# Patient Record
Sex: Male | Born: 1965 | Race: Asian | Hispanic: No | Marital: Married | Smoking: Never smoker
Health system: Southern US, Community
[De-identification: ages and names within clinical notes are randomized; demographics above are authoritative.]

## PROBLEM LIST (undated history)

## (undated) DIAGNOSIS — J45909 Unspecified asthma, uncomplicated: Secondary | ICD-10-CM

## (undated) DIAGNOSIS — E119 Type 2 diabetes mellitus without complications: Secondary | ICD-10-CM

## (undated) DIAGNOSIS — I10 Essential (primary) hypertension: Secondary | ICD-10-CM

## (undated) DIAGNOSIS — E785 Hyperlipidemia, unspecified: Secondary | ICD-10-CM

## (undated) HISTORY — DX: Hyperlipidemia, unspecified: E78.5

## (undated) HISTORY — DX: Unspecified asthma, uncomplicated: J45.909

---

## 2012-09-10 ENCOUNTER — Telehealth: Payer: Self-pay | Admitting: Oncology

## 2012-09-10 NOTE — Telephone Encounter (Signed)
C/D 09/10/12 for appt. 10/07/12

## 2012-09-10 NOTE — Telephone Encounter (Signed)
S/W PT IN REF TO NP APPT. 10/07/12@3 :00 REFERRING DR MORROW DX-ELEVATED RED BLOOD CELL COUNT MAILED NP PACKET

## 2012-10-07 ENCOUNTER — Ambulatory Visit: Payer: Self-pay | Admitting: Oncology

## 2012-10-07 ENCOUNTER — Ambulatory Visit: Payer: Self-pay

## 2012-10-07 ENCOUNTER — Other Ambulatory Visit: Payer: Self-pay | Admitting: Lab

## 2012-10-08 NOTE — Progress Notes (Signed)
Patient canceled.  Reschedule prn.  

## 2012-10-22 ENCOUNTER — Encounter: Payer: Self-pay | Admitting: Internal Medicine

## 2012-10-22 ENCOUNTER — Other Ambulatory Visit (HOSPITAL_BASED_OUTPATIENT_CLINIC_OR_DEPARTMENT_OTHER): Payer: BC Managed Care – PPO | Admitting: Lab

## 2012-10-22 ENCOUNTER — Ambulatory Visit (HOSPITAL_BASED_OUTPATIENT_CLINIC_OR_DEPARTMENT_OTHER): Payer: BC Managed Care – PPO | Admitting: Internal Medicine

## 2012-10-22 ENCOUNTER — Ambulatory Visit: Payer: BC Managed Care – PPO

## 2012-10-22 ENCOUNTER — Other Ambulatory Visit: Payer: Self-pay | Admitting: Oncology

## 2012-10-22 VITALS — BP 120/78 | HR 86 | Temp 97.8°F | Resp 20 | Ht 65.5 in | Wt 185.7 lb

## 2012-10-22 DIAGNOSIS — E785 Hyperlipidemia, unspecified: Secondary | ICD-10-CM | POA: Insufficient documentation

## 2012-10-22 DIAGNOSIS — D751 Secondary polycythemia: Secondary | ICD-10-CM

## 2012-10-22 DIAGNOSIS — J45909 Unspecified asthma, uncomplicated: Secondary | ICD-10-CM | POA: Insufficient documentation

## 2012-10-22 DIAGNOSIS — D72810 Lymphocytopenia: Secondary | ICD-10-CM

## 2012-10-22 DIAGNOSIS — I10 Essential (primary) hypertension: Secondary | ICD-10-CM

## 2012-10-22 DIAGNOSIS — R7989 Other specified abnormal findings of blood chemistry: Secondary | ICD-10-CM

## 2012-10-22 HISTORY — DX: Unspecified asthma, uncomplicated: J45.909

## 2012-10-22 HISTORY — DX: Hyperlipidemia, unspecified: E78.5

## 2012-10-22 LAB — CBC WITH DIFFERENTIAL/PLATELET
Basophils Absolute: 0 10*3/uL (ref 0.0–0.1)
Eosinophils Absolute: 0.4 10*3/uL (ref 0.0–0.5)
HGB: 15.7 g/dL (ref 13.0–17.1)
MCV: 78.7 fL — ABNORMAL LOW (ref 79.3–98.0)
NEUT#: 3 10*3/uL (ref 1.5–6.5)
RDW: 13.3 % (ref 11.0–14.6)
lymph#: 1.5 10*3/uL (ref 0.9–3.3)

## 2012-10-22 LAB — HIV ANTIBODY (ROUTINE TESTING W REFLEX): HIV: NONREACTIVE

## 2012-10-22 LAB — MORPHOLOGY
PLT EST: ADEQUATE
White Cell Comments: 1

## 2012-10-22 LAB — CHCC SMEAR

## 2012-10-22 NOTE — Progress Notes (Signed)
Checked in new pt with no financial concerns. °

## 2012-10-22 NOTE — Progress Notes (Signed)
[facility] [phone]) [phone]   [phone]  CONSULT NOTE  REFERRING PHYSICIAN: Dr. Geri Seminole  REASON FOR CONSULTATION:  47 years old white male with polycythemia.  HPI Jon Vega is a 47 y.o. male was no significant past medical history except for asthma, hypertension and dyslipidemia. The patient is a never smoker. He was found by his primary care physician to have elevated hemoglobin and hematocrit on recent blood work. He was also complaining at that time of low-grade fever as well as chills especially at night time. The symptoms happen intermittently .  Unfortunately I don't have any lab results from his primary care's office. The patient is feeling fine today with no specific complaints. He denied having any significant weight loss or night sweats. He denied having any chest pain, but shortness of breath secondary to history of asthma, no cough or hemoptysis.  He complains of occasional mild fatigue. He denied having any significant nausea or vomiting no change in bowel movement. Today he is feeling good. His family history significant for her mother with diabetes mellitus and fibromyalgia, father with heart disease and obstructive sleep apnea and brother with stroke. The patient is married and has 2 daughters. 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 He has no history of smoking, alcohol or drug abuse. @SFHPI @  Past Medical History  Diagnosis Date  . Asthma     History reviewed. No pertinent past surgical history.  Family History  Problem Relation Age of Onset  . Clotting disorder Mother   . Diabetes Mother   . Hypertension Mother   . Hypertension Father   . Stroke Brother     Social History History  Substance Use Topics  . Smoking status: Never Smoker   . Smokeless tobacco: Not on file  . Alcohol Use: 0.0 oz/week    0-1 Glasses of wine, 0-1 Cans of beer per week    No Known Allergies  Current Outpatient  Prescriptions  Medication Sig Dispense Refill  . albuterol (PROVENTIL HFA;VENTOLIN HFA) 108 (90 BASE) MCG/ACT inhaler Inhale 2 puffs into the lungs every 6 (six) hours as needed for wheezing.      Marland Kitchen aspirin 81 MG tablet Take 81 mg by mouth daily.      . milk thistle 175 MG tablet Take 175 mg by mouth daily.      . Multiple Vitamin (MULTIVITAMIN) tablet Take 1 tablet by mouth daily.      Marland Kitchen amLODipine-benazepril (LOTREL) 10-20 MG per capsule Take 1 capsule by mouth daily.      Marland Kitchen atorvastatin (LIPITOR) 20 MG tablet Take 1 tablet by mouth daily.       No current facility-administered medications for this visit.    Review of Systems  A comprehensive review of systems was negative.  Physical Exam  AVW:UJWJX, healthy, no distress, well nourished and well developed SKIN: skin color, texture, turgor are normal HEAD: Normocephalic, No masses, lesions, tenderness or abnormalities EYES: normal, PERRLA EARS: External ears normal OROPHARYNX:no exudate and no erythema  NECK: supple, no adenopathy LYMPH:  no palpable lymphadenopathy, no hepatosplenomegaly LUNGS: clear to auscultation  HEART: regular rate & rhythm, no murmurs and no gallops ABDOMEN:abdomen soft, non-tender, normal bowel sounds and no masses or organomegaly BACK: Back symmetric, no curvature. EXTREMITIES:no joint deformities, effusion, or inflammation, no edema, no skin discoloration  NEURO: alert & oriented x 3 with fluent speech, no focal motor/sensory deficits  PERFORMANCE STATUS: ECOG 0  LABORATORY DATA:  Lab Results  Component Value Date   WBC 5.4 10/22/2012   HGB 15.7 10/22/2012   HCT 47.9 10/22/2012   MCV 78.7* 10/22/2012   PLT 230 10/22/2012      Chemistry   No results found for this basename: NA, K, CL, CO2, BUN, CREATININE, GLU   No results found for this basename: CALCIUM, ALKPHOS, AST, ALT, BILITOT       RADIOGRAPHIC STUDIES: No results found.  ASSESSMENT: This is a very pleasant 47 years old white male who  presented for evaluation of polycythemia. Repeat blood work today showed no evidence of polycythemia on the CBC today.   PLAN: I discussed the lab result with the patient and his wife today. I recommended for him to continue his routine followup visit with his primary care physician. I don't see a need for any further investigation in his condition at this point. His polycythemia seen at the time of his previous bloodwork to be secondary to mild shortness of breath because of his asthma. I would be happy to see the patient an as-needed basis. I gave the patient and his wife the time to ask questions and answers and completed to their satisfaction.  All questions were answered. The patient knows to call the clinic with any problems, questions or concerns. We can certainly see the patient much sooner if necessary.  Thank you so much for allowing me to participate in the care of Jon Vega. I will continue to follow up the patient with you and assist in his care.  I spent 25 minutes counseling the patient face to face. The total time spent in the appointment was 50 minutes.  Jean Skow K. 10/22/2012, 6:44 PM

## 2012-10-22 NOTE — Patient Instructions (Addendum)
Your blood count is good Today Continue on observation with your family physician. Followup visit on as-needed basis.

## 2013-12-18 ENCOUNTER — Ambulatory Visit: Payer: BC Managed Care – PPO

## 2013-12-25 ENCOUNTER — Ambulatory Visit: Payer: BC Managed Care – PPO

## 2014-01-01 ENCOUNTER — Ambulatory Visit: Payer: BC Managed Care – PPO

## 2014-01-15 ENCOUNTER — Ambulatory Visit: Payer: BC Managed Care – PPO

## 2014-01-22 ENCOUNTER — Ambulatory Visit: Payer: BC Managed Care – PPO

## 2015-08-20 ENCOUNTER — Other Ambulatory Visit: Payer: Self-pay | Admitting: Family Medicine

## 2015-08-20 ENCOUNTER — Ambulatory Visit
Admission: RE | Admit: 2015-08-20 | Discharge: 2015-08-20 | Disposition: A | Discharge status: Home / Self Care | Payer: 59 | Source: Ambulatory Visit | Attending: Family Medicine | Admitting: Family Medicine

## 2015-08-20 DIAGNOSIS — R52 Pain, unspecified: Secondary | ICD-10-CM

## 2015-08-20 DIAGNOSIS — Z139 Encounter for screening, unspecified: Secondary | ICD-10-CM

## 2016-05-11 ENCOUNTER — Other Ambulatory Visit: Payer: Self-pay | Admitting: Family Medicine

## 2016-05-11 DIAGNOSIS — R1011 Right upper quadrant pain: Secondary | ICD-10-CM

## 2016-05-17 ENCOUNTER — Other Ambulatory Visit: Payer: 59

## 2016-09-27 ENCOUNTER — Emergency Department (HOSPITAL_COMMUNITY)
Admission: EM | Admit: 2016-09-27 | Discharge: 2016-09-27 | Disposition: A | Discharge status: Home / Self Care | Payer: 59 | Attending: Emergency Medicine | Admitting: Emergency Medicine

## 2016-09-27 ENCOUNTER — Encounter (HOSPITAL_COMMUNITY): Payer: Self-pay | Admitting: *Deleted

## 2016-09-27 DIAGNOSIS — R040 Epistaxis: Secondary | ICD-10-CM | POA: Diagnosis not present

## 2016-09-27 DIAGNOSIS — J3089 Other allergic rhinitis: Secondary | ICD-10-CM | POA: Diagnosis not present

## 2016-09-27 DIAGNOSIS — J302 Other seasonal allergic rhinitis: Secondary | ICD-10-CM

## 2016-09-27 DIAGNOSIS — Z7982 Long term (current) use of aspirin: Secondary | ICD-10-CM | POA: Insufficient documentation

## 2016-09-27 DIAGNOSIS — I10 Essential (primary) hypertension: Secondary | ICD-10-CM | POA: Insufficient documentation

## 2016-09-27 MED ORDER — OXYMETAZOLINE HCL 0.05 % NA SOLN
2.0000 | Freq: Once | NASAL | Status: AC
  Administered 2016-09-27: 2 via NASAL
  Filled 2016-09-27: qty 15

## 2016-09-27 NOTE — ED Notes (Signed)
EDP at beside  

## 2016-09-27 NOTE — ED Provider Notes (Signed)
[facility]EM[facility]Y [facility]PT Provider Note   CSN: [id number] Arrival date & time: 09/27/16  4696  By signing my name below, I, Marnette Burgess Long, attest that this documentation has been prepared under the direction and in the presence of Cristina Gong, PA-C. Electronically Signed: Marnette Burgess Long, Scribe. 09/27/2016. 10:42 AM.  History   Chief Complaint Chief Complaint  Patient presents with  . Epistaxis   The history is provided by the patient and medical records. No language interpreter was used.    HPI Comments:  Jon Vega is a 51 y.o. male with a  PMHx of Asthma, HTN, and Dyslipidemia, who presents to the Emergency Department complaining of epistaxis to the right naris onset this morning around 0600. Pt reports after waking up, he inhaled and the bleeding began spontaneously and continued to bleed until arrival in the ED around 0900. Bleeding is controlled at this time. He reports these nosebleeds are becoming more and more frequent with the last major episode occurring three days ago. Positive h/o seasonal allergies that he does not take nasal steroids or other nasal medications for. Pt has associated symptoms of sneezing and post nasal drip. Denies Trauma or any acute injuries to the area. No h/o Afib or DVT/PE. Pt takes daily ASA , no other blood thinners, at home and additionally reports he did not take his blood pressure medication this morning. Pt denies fever, chills, cough, and any other complaints at this time. Pt does have a PCP, he reports he called them this morning and they told him to come to the ED for cauterization and evaluation by ENT.    His mother has a history of von Willebrand's disease, however he reports that he has never been tested for it.   Past Medical History:  Diagnosis Date  . Asthma   . Asthma 10/22/2012  . Dyslipidemia 10/22/2012   Patient Active Problem List   Diagnosis Date Noted  . HTN (hypertension) 10/22/2012  . Dyslipidemia 10/22/2012  . Asthma  10/22/2012   History reviewed. No pertinent surgical history.  Home Medications    Prior to Admission medications   Medication Sig Start Date End Date Taking? Authorizing Provider  albuterol (PROVENTIL HFA;VENTOLIN HFA) 108 (90 BASE) MCG/ACT inhaler Inhale 2 puffs into the lungs every 6 (six) hours as needed for wheezing.    Historical Provider, MD  amLODipine-benazepril (LOTREL) 10-20 MG per capsule Take 1 capsule by mouth daily. 10/01/12   Historical Provider, MD  aspirin 81 MG tablet Take 81 mg by mouth daily.    Historical Provider, MD  atorvastatin (LIPITOR) 20 MG tablet Take 1 tablet by mouth daily. 10/01/12   Historical Provider, MD  milk thistle 175 MG tablet Take 175 mg by mouth daily.    Historical Provider, MD  Multiple Vitamin (MULTIVITAMIN) tablet Take 1 tablet by mouth daily.    Historical Provider, MD    Family History Family History  Problem Relation Age of Onset  . Clotting disorder Mother   . Diabetes Mother   . Hypertension Mother   . Hypertension Father   . Stroke Brother     Social History Social History  Substance Use Topics  . Smoking status: Never Smoker  . Smokeless tobacco: Not on file  . Alcohol use 0.0 oz/week     Allergies   Patient has no known allergies.   Review of Systems Review of Systems  Constitutional: Negative for chills and fever.  HENT: Positive for nosebleeds, postnasal drip and sneezing. Negative for dental  problem, ear pain, rhinorrhea, sinus pain and sore throat.   Eyes: Negative for pain and visual disturbance.  Respiratory: Negative for cough and shortness of breath.   Cardiovascular: Negative for chest pain and palpitations.  Gastrointestinal: Negative for abdominal pain and vomiting.  Genitourinary: Negative for dysuria and hematuria.  Musculoskeletal: Negative for arthralgias and back pain.  Skin: Negative for color change and rash.  Neurological: Negative for seizures and syncope.  Hematological: Does not bruise/bleed  easily.     Physical Exam Updated Vital Signs BP (!) 140/91 (BP Location: Right Arm)   Pulse 97   Temp 97.9 F (36.6 C) (Oral)   Resp 16   Ht  (1.702 m)   Wt 86.2 kg   SpO2 100%   BMI 29.76 kg/m   Physical Exam  Constitutional: He is oriented to person, place, and time. He appears well-developed and well-nourished. No distress.  HENT:  Head: Normocephalic and atraumatic.  Right Ear: External ear normal.  Left Ear: External ear normal.  Nose: No nose lacerations, sinus tenderness, nasal deformity, septal deviation or nasal septal hematoma. Epistaxis is observed. Right sinus exhibits no frontal sinus tenderness. Left sinus exhibits no frontal sinus tenderness.    Minimal dried blood present in right naris.   Eyes: Conjunctivae are normal.  Neck: Normal range of motion.  Cardiovascular: Normal rate and regular rhythm.   Pulmonary/Chest: Effort normal. No stridor. No respiratory distress.  Abdominal: He exhibits no distension.  Musculoskeletal: Normal range of motion. He exhibits no deformity.  Neurological: He is alert and oriented to person, place, and time.  Skin: Skin is warm and dry.  Psychiatric: He has a normal mood and affect. His behavior is normal.  Nursing note and vitals reviewed.    ED Treatments / Results  DIAGNOSTIC STUDIES:  Oxygen Saturation is 100% on RA, normal by my interpretation.   Hypertensive with no indication of urgency or emergency.  History of, reports did not take HTN meds this morning.   COORDINATION OF CARE:  10:42 AM  Patient and wife were upset when I told them I can not cauterize his nose keep him from getting future nose bleeds.  Additionally they were upset when I informed them that an emergent ENT consult and asking ENT to come to ED to evaluate patient was not appropriate today as he was not currently experiencing uncontrollable bleeding from his nose.    11:09 AM On re-evaluation, pt reports right nare has begun bleeding again.  Pt instructed to pinch nares and hold for five minutes.  On return evaluation patient was not holding pressure on his nose.  Bleeding is not excessive in volume.  A potential site of slow oozing was located on the edge of the nare.  Patient asked to see my attending doctor.   11:12 AM Consulted with Dr. Criss Alvine who saw the patient as Dr. Corlis Leak was actively treating multiple high acuity patients.     Labs (all labs ordered are listed, but only abnormal results are displayed) Labs Reviewed - No data to display  EKG  EKG Interpretation None       Radiology No results found.  Procedures Procedures (including critical care time)  Medications Ordered in ED Medications  oxymetazoline (AFRIN) 0.05 % nasal spray 2 spray (2 sprays Right Nare Given 09/27/16 1217)     Initial Impression / Assessment and Plan / ED Course  I have reviewed the triage vital signs and the nursing notes.  Pertinent labs & imaging results  that were available during my care of the patient were reviewed by me and considered in my medical decision making (see chart for details).    Yamato Telfair presented today with epistaxis that he stated did not stop for 3 hours.  Upon initial exam there was no bleeding present.  While in the ED he started having epistaxis again. Dr. Criss Alvine saw the patient and was able to cauterize an area of his right nare.     At this time there does not appear to be any evidence of an acute emergency medical condition and the patient appears stable for discharge with appropriate outpatient follow up with ENT and PCP.  Diagnosis was discussed with patient who verbalizes understanding and is agreeable to discharge. Pt case discussed with Dr. Criss Alvine who agrees with my plan. Patient was instructed to use afrin as needed for nose bleeds with instructions to apply it sparingly with a Q tip into his nostril.  He was advised to avoid prolonged use of afrin.  He was noted to be hypertensive while in  the ED, reports that he did not take his home blood pressure medications this morning. He was advised to maintain compliance with all blood pressure medications.   Final Clinical Impressions(s) / ED Diagnoses   Final diagnoses:  Hypertension, unspecified type  Acute seasonal allergic rhinitis due to other allergen  Right-sided epistaxis  Bleeding nose    New Prescriptions Discharge Medication List as of 09/27/2016 12:05 PM     I personally performed the services described in this documentation, which was scribed in my presence. The recorded information has been reviewed and is accurate.     Cristina Gong, PA-C 09/27/16 1723    Pricilla Loveless, MD 09/28/16 [phone]

## 2016-09-27 NOTE — ED Provider Notes (Signed)
[facility] Provider Note   CSN: [id number] Arrival date & time: 09/27/16  0910     History   Chief Complaint Chief Complaint  Patient presents with  . Epistaxis    HPI Jon Vega is a 51 y.o. male.  HPI  Past Medical History:  Diagnosis Date  . Asthma   . Asthma 10/22/2012  . Dyslipidemia 10/22/2012    Patient Active Problem List   Diagnosis Date Noted  . HTN (hypertension) 10/22/2012  . Dyslipidemia 10/22/2012  . Asthma 10/22/2012    History reviewed. No pertinent surgical history.     Home Medications    Prior to Admission medications   Medication Sig Start Date End Date Taking? Authorizing Provider  albuterol (PROVENTIL HFA;VENTOLIN HFA) 108 (90 BASE) MCG/ACT inhaler Inhale 2 puffs into the lungs every 6 (six) hours as needed for wheezing.    Historical Provider, MD  amLODipine-benazepril (LOTREL) 10-20 MG per capsule Take 1 capsule by mouth daily. 10/01/12   Historical Provider, MD  aspirin 81 MG tablet Take 81 mg by mouth daily.    Historical Provider, MD  atorvastatin (LIPITOR) 20 MG tablet Take 1 tablet by mouth daily. 10/01/12   Historical Provider, MD  milk thistle 175 MG tablet Take 175 mg by mouth daily.    Historical Provider, MD  Multiple Vitamin (MULTIVITAMIN) tablet Take 1 tablet by mouth daily.    Historical Provider, MD    Family History Family History  Problem Relation Age of Onset  . Clotting disorder Mother   . Diabetes Mother   . Hypertension Mother   . Hypertension Father   . Stroke Brother     Social History Social History  Substance Use Topics  . Smoking status: Never Smoker  . Smokeless tobacco: Not on file  . Alcohol use 0.0 oz/week     Allergies   Patient has no known allergies.   Review of Systems Review of Systems   Physical Exam Updated Vital Signs BP (!) 140/91 (BP Location: Right Arm)   Pulse 97   Temp 97.9 F (36.6 C) (Oral)   Resp 16   Ht  (1.702 m)   Wt 190 lb (86.2 kg)   SpO2 100%   BMI  29.76 kg/m   Physical Exam   ED Treatments / Results  Labs (all labs ordered are listed, but only abnormal results are displayed) Labs Reviewed - No data to display  EKG  EKG Interpretation None       Radiology No results found.  Procedures .Epistaxis Management Date/Time: 09/27/2016 12:01 PM Performed by: Pricilla Loveless Authorized by: Pricilla Loveless   Consent:    Consent obtained:  Verbal   Consent given by:  Patient Procedure details:    Treatment site:  R anterior   Treatment method:  Silver nitrate   Treatment complexity:  Limited   Treatment episode: recurring   Post-procedure details:    Assessment:  Bleeding stopped   Patient tolerance of procedure:  Tolerated well, no immediate complications   (including critical care time)  Medications Ordered in ED Medications  oxymetazoline (AFRIN) 0.05 % nasal spray 2 spray (not administered)     Initial Impression / Assessment and Plan / ED Course  I have reviewed the triage vital signs and the nursing notes.  Pertinent labs & imaging results that were available during my care of the patient were reviewed by me and considered in my medical decision making (see chart for details).     Patient presents with  on and off right nare bleeding. Stable on my exam. There is a small area of friable tissue in the right anterior knee air. Likely the bleeding source. Likely all of this is coming from the recurrent seasonal allergies over the past week. Discussed using humidifiers, Afrin, treating his allergies which is not really doing. Given the small area of friability, it was cauterized with silver nitrate as above. Stable after this. Discharge home with ENT follow-up. No signs or symptoms of significant blood loss.  Final Clinical Impressions(s) / ED Diagnoses   Final diagnoses:  Hypertension, unspecified type  Acute seasonal allergic rhinitis due to other allergen  Right-sided epistaxis  Bleeding nose    New  Prescriptions New Prescriptions   No medications on file     Pricilla Loveless, MD 09/27/16 1209

## 2016-09-27 NOTE — ED Notes (Signed)
EDP at bedside  

## 2016-09-27 NOTE — ED Triage Notes (Signed)
Pt reports onset this am of nosebleed from right nare. Takes asa, hx of htn. Reports recently having frequent nosebleeds. Minimal bleeding noted at triage.

## 2017-02-04 IMAGING — CR DG CHEST 2V
2 series · 2 of 2 positions shown · non-contrast
Comparison: No prior .

CLINICAL DATA: Chills.  Low-grade fever.

EXAM:
CHEST  2 VIEW

[w chest pa]
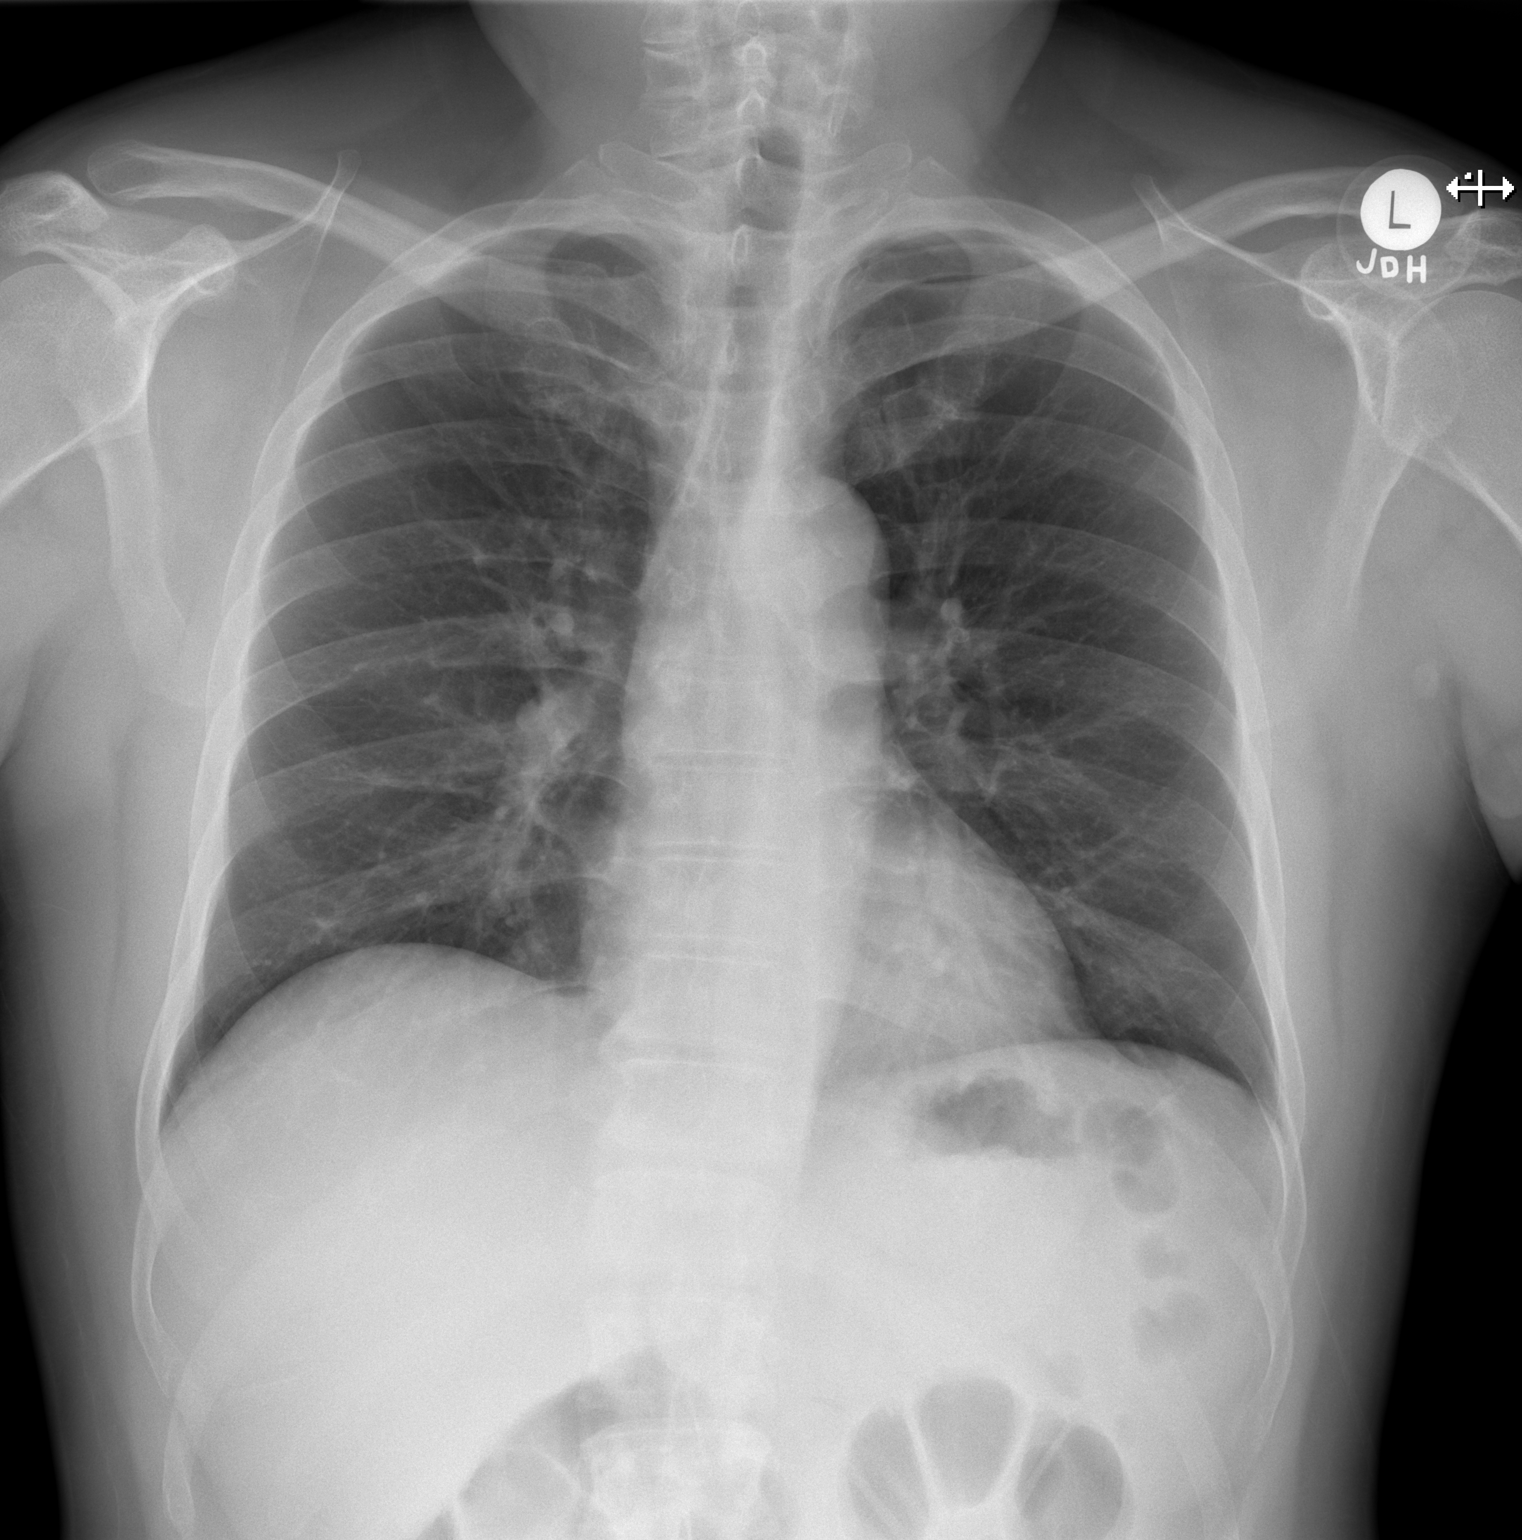

[w chest lat]
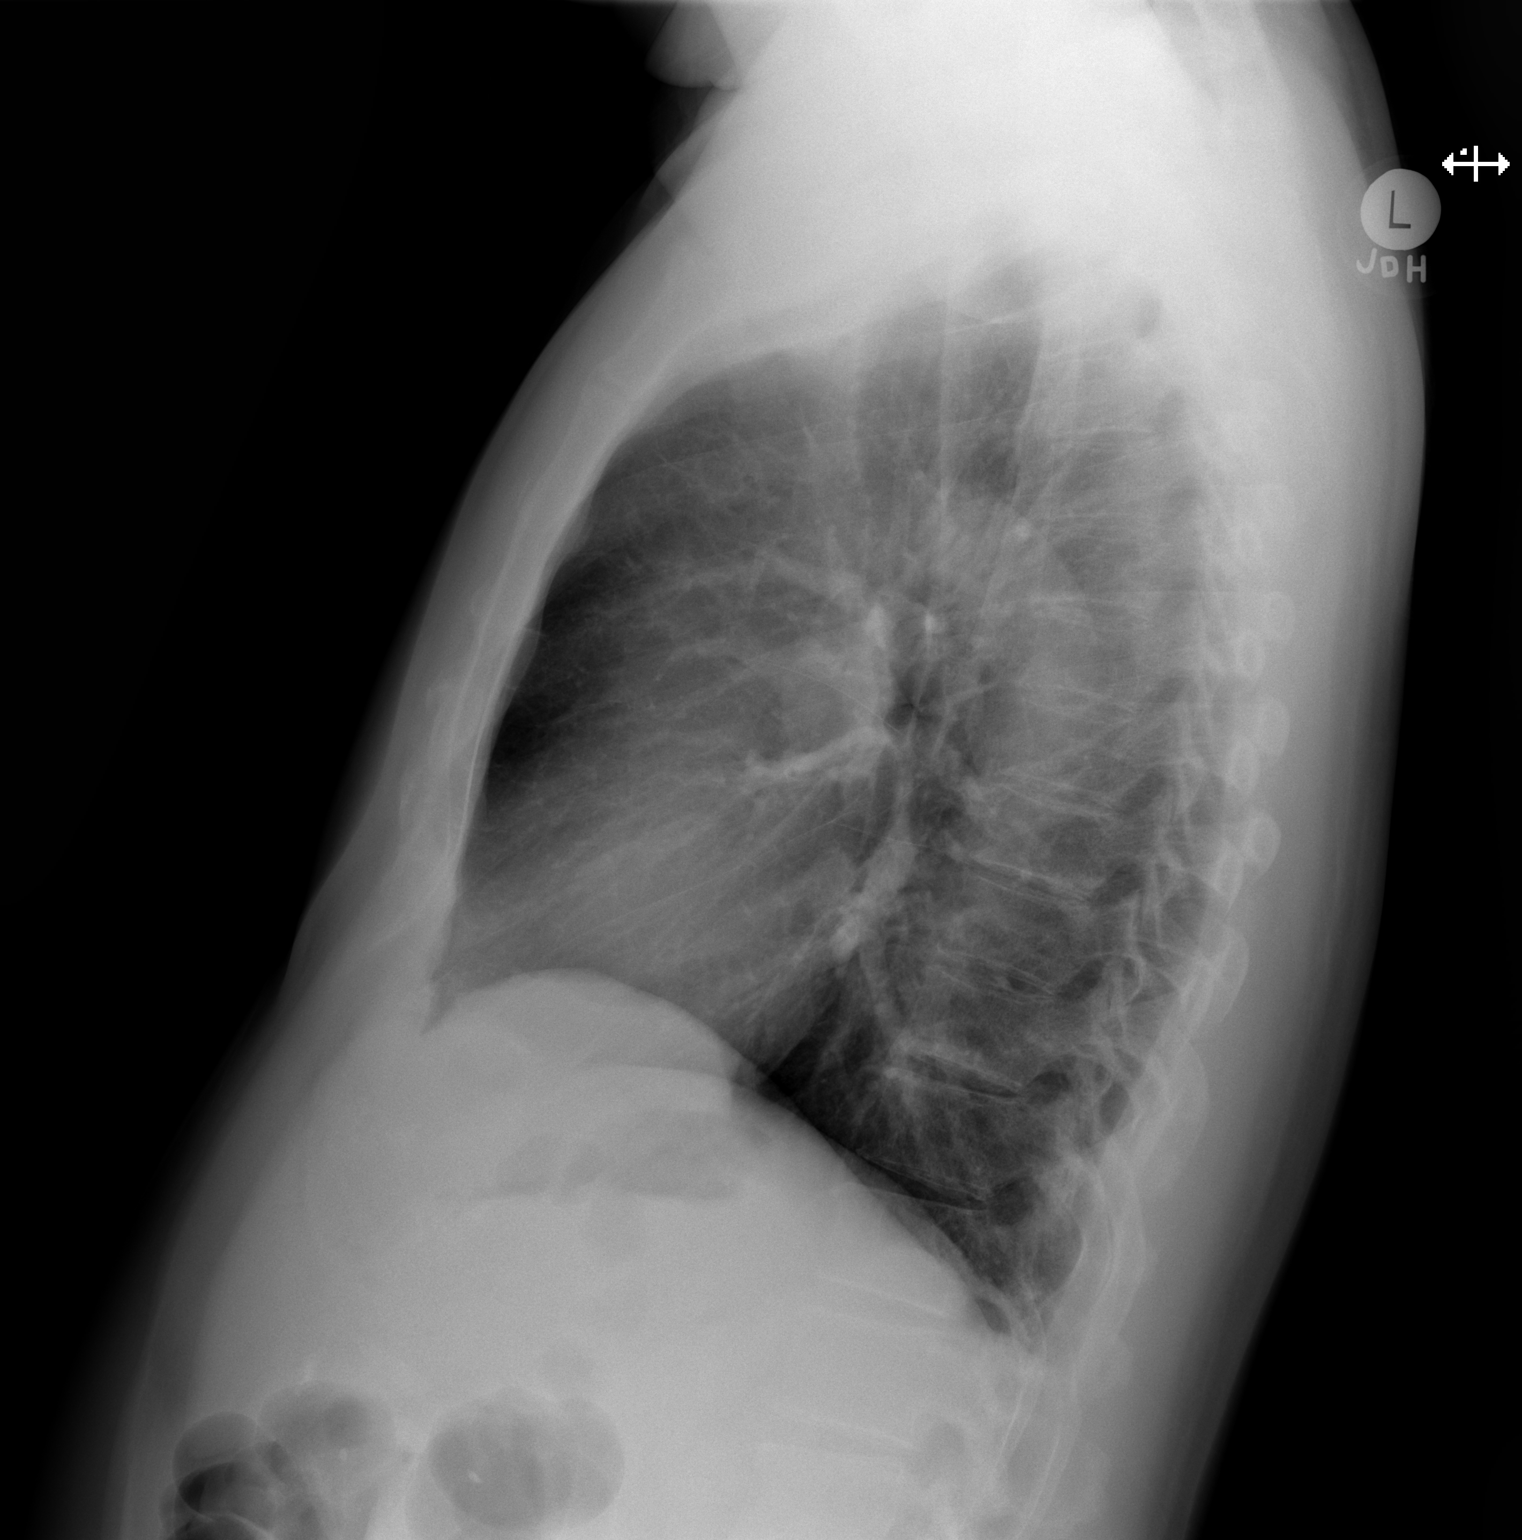

[2 of 2 positions shown; findings below may reference images not displayed]

FINDINGS: Mediastinum hilar structures normal. Mild right infrahilar
subsegmental atelectasis and or infiltrate. No pleural effusion
pneumothorax. Heart size normal. No acute bony abnormality.
IMPRESSION: Mild right infrahilar subsegmental atelectasis and or infiltrate .

## 2019-04-18 ENCOUNTER — Other Ambulatory Visit: Payer: Self-pay

## 2019-04-18 DIAGNOSIS — Z20822 Contact with and (suspected) exposure to covid-19: Secondary | ICD-10-CM

## 2019-04-19 LAB — NOVEL CORONAVIRUS, NAA: SARS-CoV-2, NAA: NOT DETECTED

## 2020-02-07 ENCOUNTER — Emergency Department (HOSPITAL_BASED_OUTPATIENT_CLINIC_OR_DEPARTMENT_OTHER)
Admission: EM | Admit: 2020-02-07 | Discharge: 2020-02-07 | Disposition: A | Discharge status: Home / Self Care | Payer: BC Managed Care – PPO | Attending: Emergency Medicine | Admitting: Emergency Medicine

## 2020-02-07 ENCOUNTER — Other Ambulatory Visit: Payer: Self-pay

## 2020-02-07 ENCOUNTER — Encounter (HOSPITAL_BASED_OUTPATIENT_CLINIC_OR_DEPARTMENT_OTHER): Payer: Self-pay

## 2020-02-07 DIAGNOSIS — W19XXXA Unspecified fall, initial encounter: Secondary | ICD-10-CM | POA: Insufficient documentation

## 2020-02-07 DIAGNOSIS — Y999 Unspecified external cause status: Secondary | ICD-10-CM | POA: Insufficient documentation

## 2020-02-07 DIAGNOSIS — I10 Essential (primary) hypertension: Secondary | ICD-10-CM | POA: Insufficient documentation

## 2020-02-07 DIAGNOSIS — S025XXA Fracture of tooth (traumatic), initial encounter for closed fracture: Secondary | ICD-10-CM

## 2020-02-07 DIAGNOSIS — E119 Type 2 diabetes mellitus without complications: Secondary | ICD-10-CM | POA: Insufficient documentation

## 2020-02-07 DIAGNOSIS — Z23 Encounter for immunization: Secondary | ICD-10-CM | POA: Diagnosis not present

## 2020-02-07 DIAGNOSIS — Z79899 Other long term (current) drug therapy: Secondary | ICD-10-CM | POA: Insufficient documentation

## 2020-02-07 DIAGNOSIS — S0993XA Unspecified injury of face, initial encounter: Secondary | ICD-10-CM | POA: Diagnosis present

## 2020-02-07 DIAGNOSIS — Z7982 Long term (current) use of aspirin: Secondary | ICD-10-CM | POA: Insufficient documentation

## 2020-02-07 DIAGNOSIS — Y939 Activity, unspecified: Secondary | ICD-10-CM | POA: Diagnosis not present

## 2020-02-07 DIAGNOSIS — S01511A Laceration without foreign body of lip, initial encounter: Secondary | ICD-10-CM | POA: Diagnosis not present

## 2020-02-07 DIAGNOSIS — Y929 Unspecified place or not applicable: Secondary | ICD-10-CM | POA: Diagnosis not present

## 2020-02-07 DIAGNOSIS — J45909 Unspecified asthma, uncomplicated: Secondary | ICD-10-CM | POA: Insufficient documentation

## 2020-02-07 HISTORY — DX: Essential (primary) hypertension: I10

## 2020-02-07 HISTORY — DX: Type 2 diabetes mellitus without complications: E11.9

## 2020-02-07 MED ORDER — IBUPROFEN 400 MG PO TABS
600.0000 mg | ORAL_TABLET | Freq: Once | ORAL | Status: AC
  Administered 2020-02-07: 19:00:00 600 mg via ORAL
  Filled 2020-02-07: qty 1

## 2020-02-07 MED ORDER — TETANUS-DIPHTH-ACELL PERTUSSIS 5-2.5-18.5 LF-MCG/0.5 IM SUSP
0.5000 mL | Freq: Once | INTRAMUSCULAR | Status: AC
  Administered 2020-02-07: 0.5 mL via INTRAMUSCULAR
  Filled 2020-02-07: qty 0.5

## 2020-02-07 NOTE — Discharge Instructions (Addendum)
Rinse your mouth frequently to help prevent infection. Apply ice for 20 minutes at a time to help with pain and swelling. Call your dentist on Monday to schedule an appointment for repair of your broken tooth, and evaluation of your painful tooth.  You can take ibuprofen every 6 hours for pain and swelling. Return to the ED for signs of infection to your wound.

## 2020-02-07 NOTE — ED Triage Notes (Signed)
Pt tripped and fell on concrete while at home. Pt has lac to bottom lip, chipped tooth, and nose bleed. Pt denies LOC.

## 2020-02-07 NOTE — ED Provider Notes (Signed)
[facility] EMERGENCY DEPARTMENT Provider Note   CSN: [id number] Arrival date & time: 02/07/20  1428     History Chief Complaint  Patient presents with  . Fall    Jon Vega is a 54 y.o. male presenting to the ED with complaint of mouth injury after mechanical fall that occurred prior to arrival. Pt states he accidentally missed the last step into his garage and fell forward hitting his mouth on the ground.  He reports laceration to his bottom lip, broken left upper tooth and painful right upper tooth.  He denies LOC, not on anticoagulation.  Denies headache, neck pain, back pain, vomiting, vision changes.  He does have a dentist.  Last tetanus immunization was over 5 years ago.  The history is provided by the patient.       Past Medical History:  Diagnosis Date  . Asthma   . Asthma 10/22/2012  . Diabetes mellitus without complication (HCC)   . Dyslipidemia 10/22/2012  . Hypertension     Patient Active Problem List   Diagnosis Date Noted  . HTN (hypertension) 10/22/2012  . Dyslipidemia 10/22/2012  . Asthma 10/22/2012    History reviewed. No pertinent surgical history.     Family History  Problem Relation Age of Onset  . Clotting disorder Mother   . Diabetes Mother   . Hypertension Mother   . Hypertension Father   . Stroke Brother     Social History   Tobacco Use  . Smoking status: Never Smoker  . Smokeless tobacco: Never Used  Vaping Use  . Vaping Use: Never used  Substance Use Topics  . Alcohol use: Yes    Alcohol/week: 0.0 - 2.0 standard drinks  . Drug use: Never    Home Medications Prior to Admission medications   Medication Sig Start Date End Date Taking? Authorizing Provider  albuterol (PROVENTIL HFA;VENTOLIN HFA) 108 (90 BASE) MCG/ACT inhaler Inhale 2 puffs into the lungs every 6 (six) hours as needed for wheezing.    [provider]  amLODipine-benazepril (LOTREL) 10-20 MG per capsule Take 1 capsule by mouth daily. 10/01/12    [provider]  aspirin 81 MG tablet Take 81 mg by mouth daily.    [provider]  atorvastatin (LIPITOR) 20 MG tablet Take 1 tablet by mouth daily. 10/01/12   [provider]  milk thistle 175 MG tablet Take 175 mg by mouth daily.    [provider]  Multiple Vitamin (MULTIVITAMIN) tablet Take 1 tablet by mouth daily.    [provider]    Allergies    Patient has no known allergies.  Review of Systems   Review of Systems  HENT: Positive for dental problem.   Eyes: Negative for visual disturbance.  Gastrointestinal: Negative for vomiting.  Musculoskeletal: Negative for back pain and neck pain.  Skin: Positive for wound.  Neurological: Negative for syncope and headaches.    Physical Exam Updated Vital Signs BP (!) 149/93 (BP Location: Right Arm)   Pulse 79   Temp 98.3 F (36.8 C) (Oral)   Resp 18   Ht 5\' 6"  (1.676 m)   Wt 81.6 kg   SpO2 98%   BMI 29.05 kg/m   Physical Exam Vitals and nursing note reviewed.  Constitutional:      General: He is not in acute distress.    Appearance: He is well-developed.  HENT:     Head: Normocephalic and atraumatic.     Mouth/Throat:     Comments: 1.5cm  superficial laceration involving the lower lip, 75% of the laceration is to the mucosal aspect of the lip. No involvement of the vermilion border. Wound is not actively bleeding.The left upper central incisor is broken, nontender, nerve root is not exposed. The right upper central incisor is tender, not obviously loose.  Normal movement of the jaw w/o pain Eyes:     Extraocular Movements: Extraocular movements intact.     Conjunctiva/sclera: Conjunctivae normal.  Cardiovascular:     Rate and Rhythm: Normal rate.  Pulmonary:     Effort: Pulmonary effort is normal.  Musculoskeletal:     Cervical back: Normal range of motion.  Neurological:     Mental Status: He is alert.  Psychiatric:        Mood and Affect: Mood normal.        Behavior:  Behavior normal.     ED Results / Procedures / Treatments   Labs (all labs ordered are listed, but only abnormal results are displayed) Labs Reviewed - No data to display  EKG None  Radiology No results found.  Procedures Procedures (including critical care time)  Medications Ordered in ED Medications  ibuprofen (ADVIL) tablet 600 mg (600 mg Oral Given 02/07/20 1926)  Tdap (BOOSTRIX) injection 0.5 mL (0.5 mLs Intramuscular Given 02/07/20 1928)    ED Course  I have reviewed the triage vital signs and the nursing notes.  Pertinent labs & imaging results that were available during my care of the patient were reviewed by me and considered in my medical decision making (see chart for details).    MDM Rules/Calculators/A&P                          Pt presenting with lip laceration and tooth injury after slip and fall prior to arrival. No LOC, not on anticoagulation.  No headache or symptoms concerning for concussion or head injury.  The lip laceration is mostly involving the mucosal aspect of the lip, does not require closure.  No involvement of the vermilion border.  The left upper central incisor is broken, nontender, no nerve exposed.  The right upper central incisor is tender though not obviously loose.  Wound irrigated in the ED, Tdap updated.  Ibuprofen for pain.  He is instructed of wound care, and importance of close follow-up with his dentist for repair.  Symptomatic management.  Discussed symptoms of concussion and precautions associated.  Discussed reasons return to the ED.  He verbalized understanding agrees with care plan.  Patient discussed with Dr. Bernette Mayers. Final Clinical Impression(s) / ED Diagnoses Final diagnoses:  Fall in home, initial encounter  Fracture of tooth (traumatic), initial encounter for closed fracture  Lip laceration, initial encounter    Rx / DC Orders ED Discharge Orders    None       [place] Foerster, [place] N, PA-C 02/07/20 2016    Pollyann Savoy, MD 02/07/20 2225

## 2020-08-26 ENCOUNTER — Other Ambulatory Visit: Payer: Self-pay

## 2020-08-30 ENCOUNTER — Other Ambulatory Visit: Payer: Self-pay

## 2020-08-30 ENCOUNTER — Ambulatory Visit (INDEPENDENT_AMBULATORY_CARE_PROVIDER_SITE_OTHER): Payer: BC Managed Care – PPO | Admitting: Endocrinology

## 2020-08-30 VITALS — BP 130/80 | HR 90 | Ht 66.0 in | Wt 173.4 lb

## 2020-08-30 DIAGNOSIS — E119 Type 2 diabetes mellitus without complications: Secondary | ICD-10-CM | POA: Diagnosis not present

## 2020-08-30 DIAGNOSIS — E1169 Type 2 diabetes mellitus with other specified complication: Secondary | ICD-10-CM | POA: Diagnosis not present

## 2020-08-30 LAB — POCT GLYCOSYLATED HEMOGLOBIN (HGB A1C): Hemoglobin A1C: 6.7 % — AB (ref 4.0–5.6)

## 2020-08-30 MED ORDER — METFORMIN HCL ER 500 MG PO TB24: 2000.0000 mg | ORAL_TABLET | Freq: Every day | ORAL | 3 refills | Status: DC

## 2020-08-30 MED ORDER — DAPAGLIFLOZIN PROPANEDIOL 5 MG PO TABS: 5.0000 mg | ORAL_TABLET | Freq: Every day | ORAL | 3 refills | Status: DC

## 2020-08-30 NOTE — Progress Notes (Signed)
Subjective:    Patient ID: Jon Vega, male    DOB: [birth year], 55 y.o.   MRN: [id number]  HPI pt is referred by Dr Docia Chuck, for diabetes.  Pt states DM was dx'ed in 2017; he is unaware of any chronic complications; he has never been on insulin; pt says his diet and exercise are good; he has never had pancreatitis, pancreatic surgery, severe hypoglycemia or DKA.  He takes metformin only.  He says cbg varies from 87-120.  He has recently lost 10 lbs--intentional.  He could not afford Jardiance.  He stopped Trulicity, in favor of a renewed diet/exercive effort.   Past Medical History:  Diagnosis Date  . Asthma   . Asthma 10/22/2012  . Diabetes mellitus without complication (HCC)   . Dyslipidemia 10/22/2012  . Hypertension     No past surgical history on file.  Social History   Socioeconomic History  . Marital status: Married    Spouse name: Not on file  . Number of children: Not on file  . Years of education: Not on file  . Highest education level: Not on file  Occupational History  . Not on file  Tobacco Use  . Smoking status: Never Smoker  . Smokeless tobacco: Never Used  Vaping Use  . Vaping Use: Never used  Substance and Sexual Activity  . Alcohol use: Yes    Alcohol/week: 0.0 - 2.0 standard drinks  . Drug use: Never  . Sexual activity: Not on file  Other Topics Concern  . Not on file  Social History Narrative  . Not on file   Social Determinants of Health   Financial Resource Strain: Not on file  Food Insecurity: Not on file  Transportation Needs: Not on file  Physical Activity: Not on file  Stress: Not on file  Social Connections: Not on file  Intimate Partner Violence: Not on file    Current Outpatient Medications on File Prior to Visit  Medication Sig Dispense Refill  . albuterol (PROVENTIL HFA;VENTOLIN HFA) 108 (90 BASE) MCG/ACT inhaler Inhale 2 puffs into the lungs every 6 (six) hours as needed for wheezing.    Marland Kitchen amLODipine-benazepril (LOTREL) 10-20 MG  per capsule Take 1 capsule by mouth daily.    Marland Kitchen aspirin 81 MG tablet Take 81 mg by mouth daily.    Marland Kitchen atorvastatin (LIPITOR) 20 MG tablet Take 1 tablet by mouth daily.    . milk thistle 175 MG tablet Take 175 mg by mouth daily.    . Multiple Vitamin (MULTIVITAMIN) tablet Take 1 tablet by mouth daily.     No current facility-administered medications on file prior to visit.    No Known Allergies  Family History  Problem Relation Age of Onset  . Clotting disorder Mother   . Diabetes Mother   . Hypertension Mother   . Hypertension Father   . Stroke Brother     BP 130/80 (BP Location: Right Arm, Patient Position: Sitting, Cuff Size: Normal)   Pulse 90   Ht 5\' 6"  (1.676 m)   Wt 173 lb 6.4 oz (78.7 kg)   SpO2 96%   BMI 27.99 kg/m    Review of Systems denies blurry vision, chest pain, sob, n/v, urinary frequency, memory loss, and depression.       Objective:   Physical Exam VITAL SIGNS:  See vs page GENERAL: no distress Pulses: dorsalis pedis intact bilat.   MSK: no deformity of the feet CV: no leg edema.   Skin:  no ulcer on  the feet.  normal color and temp on the feet. Neuro: sensation is intact to touch on the feet.    Lab Results  Component Value Date   HGBA1C 6.7 (A) 08/30/2020   outside test results are reviewed: Creat=1.2  I have reviewed outside records, and summarized: Pt was noted to have elevated A1c, and referred here.  He was also seen by hematol, and also in the ER, for polycythemia      Assessment & Plan:  Type 2 DM: uncontrolled, as goal A1c is low 6's.    Patient Instructions  good diet and exercise significantly improve the control of your diabetes.  please let me know if you wish to be referred to a dietician.  high blood sugar is very risky to your health.  you should see an eye doctor and dentist every year.  It is very important to get all recommended vaccinations.  Controlling your blood pressure and cholesterol drastically reduces the damage  diabetes does to your body.  Those who smoke should quit.  Please discuss these with your doctor.  check your blood sugar once a day.  vary the time of day when you check, between before the 3 meals, and at bedtime.  also check if you have symptoms of your blood sugar being too high or too low.  please keep a record of the readings and bring it to your next appointment here (or you can bring the meter itself).  You can write it on any piece of paper.  please call us sooner if your blood sugar goes below 70, or if most of your readings are over 200.   Please continue the same metformin, and:  I have sent a prescription to your pharmacy, to add "Marcelline Deist."   Please come back for a follow-up appointment in 3 months.

## 2020-08-30 NOTE — Patient Instructions (Addendum)
good diet and exercise significantly improve the control of your diabetes.  please let me know if you wish to be referred to a dietician.  high blood sugar is very risky to your health.  you should see an eye doctor and dentist every year.  It is very important to get all recommended vaccinations.  Controlling your blood pressure and cholesterol drastically reduces the damage diabetes does to your body.  Those who smoke should quit.  Please discuss these with your doctor.  check your blood sugar once a day.  vary the time of day when you check, between before the 3 meals, and at bedtime.  also check if you have symptoms of your blood sugar being too high or too low.  please keep a record of the readings and bring it to your next appointment here (or you can bring the meter itself).  You can write it on any piece of paper.  please call us sooner if your blood sugar goes below 70, or if most of your readings are over 200.   Please continue the same metformin, and:  I have sent a prescription to your pharmacy, to add "Marcelline Deist."   Please come back for a follow-up appointment in 3 months.

## 2020-08-31 DIAGNOSIS — E119 Type 2 diabetes mellitus without complications: Secondary | ICD-10-CM | POA: Insufficient documentation

## 2020-11-23 ENCOUNTER — Telehealth: Payer: Self-pay | Admitting: Endocrinology

## 2020-11-23 NOTE — Telephone Encounter (Signed)
New message    1. Which medications need to be refilled? (please list name of each medication and dose if known) dapagliflozin propanediol (FARXIGA) 5 MG TABS tablet  2. Which pharmacy/location (including street and city if local pharmacy) is medication to be sent to?[facility] [address] [place]    3. Do they need a 30 day or 90 day supply? 90 days supply

## 2020-11-24 NOTE — Telephone Encounter (Signed)
Message sent thru MyChart 

## 2020-11-26 ENCOUNTER — Ambulatory Visit: Payer: BC Managed Care – PPO | Admitting: Endocrinology

## 2020-11-29 NOTE — Telephone Encounter (Signed)
Patient called again re: [facility] has not received RX for [place]. Patient states he had to switch PHARM due to cost.  Patient requests new RX for 90 day supply with refills for dapagliflozin propanediol (FARXIGA) 5 MG TABS tablet be sent asap to:   [facility] [address] [place]   Patient states he has been out of [place] for 2 weeks due to cost of medication at [facility], therefore, Patient changed PHARM per previous request  Patient requests to be called at ph# [phone] for status of the above request

## 2020-12-01 MED ORDER — DAPAGLIFLOZIN PROPANEDIOL 5 MG PO TABS: 5.0000 mg | ORAL_TABLET | Freq: Every day | ORAL | 3 refills | Status: DC

## 2020-12-01 NOTE — Telephone Encounter (Signed)
Refill sent as requested to [facility]  Message left for patient.

## 2021-12-15 ENCOUNTER — Other Ambulatory Visit: Payer: Self-pay

## 2021-12-15 DIAGNOSIS — E1169 Type 2 diabetes mellitus with other specified complication: Secondary | ICD-10-CM

## 2021-12-15 MED ORDER — DAPAGLIFLOZIN PROPANEDIOL 5 MG PO TABS: 5.0000 mg | ORAL_TABLET | Freq: Every day | ORAL | 0 refills | Status: DC

## 2022-03-06 LAB — HM DIABETES FOOT EXAM: HM Diabetic Foot Exam: NORMAL

## 2022-03-10 ENCOUNTER — Other Ambulatory Visit: Payer: Self-pay | Admitting: Endocrinology

## 2022-03-10 DIAGNOSIS — E1169 Type 2 diabetes mellitus with other specified complication: Secondary | ICD-10-CM

## 2022-03-11 ENCOUNTER — Other Ambulatory Visit: Payer: Self-pay | Admitting: Endocrinology

## 2022-03-11 DIAGNOSIS — E1169 Type 2 diabetes mellitus with other specified complication: Secondary | ICD-10-CM

## 2022-03-11 DIAGNOSIS — E785 Hyperlipidemia, unspecified: Secondary | ICD-10-CM

## 2022-03-11 DIAGNOSIS — E119 Type 2 diabetes mellitus without complications: Secondary | ICD-10-CM

## 2022-03-14 ENCOUNTER — Other Ambulatory Visit (INDEPENDENT_AMBULATORY_CARE_PROVIDER_SITE_OTHER): Payer: BC Managed Care – PPO

## 2022-03-14 DIAGNOSIS — E119 Type 2 diabetes mellitus without complications: Secondary | ICD-10-CM

## 2022-03-14 DIAGNOSIS — E785 Hyperlipidemia, unspecified: Secondary | ICD-10-CM | POA: Diagnosis not present

## 2022-03-14 LAB — LIPID PANEL
Cholesterol: 176 mg/dL (ref 0–200)
HDL: 36.9 mg/dL — ABNORMAL LOW (ref >39.00)
LDL Cholesterol: 111 mg/dL — ABNORMAL HIGH (ref 0–99)
NonHDL: 139.35
Total CHOL/HDL Ratio: 5
Triglycerides: 141 mg/dL (ref 0.0–149.0)
VLDL: 28.2 mg/dL (ref 0.0–40.0)

## 2022-03-14 LAB — COMPREHENSIVE METABOLIC PANEL
ALT: 14 U/L (ref 0–53)
AST: 14 U/L (ref 0–37)
Albumin: 4 g/dL (ref 3.5–5.2)
Alkaline Phosphatase: 74 U/L (ref 39–117)
BUN: 19 mg/dL (ref 6–23)
CO2: 27 mEq/L (ref 19–32)
Calcium: 9.1 mg/dL (ref 8.4–10.5)
Chloride: 103 mEq/L (ref 96–112)
Creatinine, Ser: 0.96 mg/dL (ref 0.40–1.50)
GFR: 88.07 mL/min (ref >60.00)
Glucose, Bld: 146 mg/dL — ABNORMAL HIGH (ref 70–99)
Potassium: 4.2 mEq/L (ref 3.5–5.1)
Sodium: 136 mEq/L (ref 135–145)
Total Bilirubin: 0.8 mg/dL (ref 0.2–1.2)
Total Protein: 7.6 g/dL (ref 6.0–8.3)

## 2022-03-14 LAB — MICROALBUMIN / CREATININE URINE RATIO
Creatinine,U: 148.1 mg/dL
Microalb Creat Ratio: 1.2 mg/g (ref 0.0–30.0)
Microalb, Ur: 1.8 mg/dL (ref 0.0–1.9)

## 2022-03-14 LAB — HEMOGLOBIN A1C: Hgb A1c MFr Bld: 7.7 % — ABNORMAL HIGH (ref 4.6–6.5)

## 2022-03-16 ENCOUNTER — Ambulatory Visit: Payer: BC Managed Care – PPO | Admitting: Endocrinology

## 2022-03-20 ENCOUNTER — Ambulatory Visit (INDEPENDENT_AMBULATORY_CARE_PROVIDER_SITE_OTHER): Payer: BC Managed Care – PPO | Admitting: Endocrinology

## 2022-03-20 ENCOUNTER — Encounter: Payer: Self-pay | Admitting: Endocrinology

## 2022-03-20 VITALS — BP 138/82 | HR 84 | Ht 66.0 in | Wt 174.8 lb

## 2022-03-20 DIAGNOSIS — I10 Essential (primary) hypertension: Secondary | ICD-10-CM

## 2022-03-20 DIAGNOSIS — E785 Hyperlipidemia, unspecified: Secondary | ICD-10-CM

## 2022-03-20 DIAGNOSIS — E1165 Type 2 diabetes mellitus with hyperglycemia: Secondary | ICD-10-CM | POA: Diagnosis not present

## 2022-03-20 NOTE — Progress Notes (Signed)
Patient ID: Jon Vega, male   DOB: 1966-01-05, 56 y.o.   MRN: [id number]           Reason for Appointment: Type II Diabetes follow-up   History of Present Illness   Diagnosis date: 2017  Previous history:  Non-insulin hypoglycemic drugs previously used: Metformin, Marcelline Deist He has only been seen once in consultation previously last year  A1c before his initial consultation was 7.5  Recent history:     Non-insulin hypoglycemic drugs: Farxiga        Side effects from medications: None  Current self management, blood sugar patterns and problems identified:  A1c is 7.7, he thinks that previously with his PCP it was 7.1 Lab fasting glucose was 146 but unclear how often he is monitoring at home Usually not checking after meals He starts to be relatively active Weight is about the same as last year He says he takes 2 tablets of metformin twice a day but after his morning dose he may get some diarrhea  Exercise: He does try to walk regularly and play kickball  Diet management: He is avoiding fried food, sweet drinks and red meat; he thinks he is cutting back on rice portions Usually only eating toast in the morning for breakfast, usually will get some protein like chicken or fish at lunch and dinner     Hypoglycemia:  none    Glucometer: Unknown     Blood Glucose readings from recall:  PRE-MEAL Fasting Lunch Dinner Bedtime Overall  Glucose range: 120-140      Mean/median:        POST-MEAL PC Breakfast PC Lunch PC Dinner  Glucose range:   ?  Mean/median:       Dietician visit: Most recent:    None  Weight control:  Wt Readings from Last 3 Encounters:  03/20/22 174 lb 12.8 oz (79.3 kg)  08/30/20 173 lb 6.4 oz (78.7 kg)  02/07/20 180 lb (81.6 kg)            Diabetes labs:  Lab Results  Component Value Date   HGBA1C 7.7 (H) 03/14/2022   HGBA1C 6.7 (A) 08/30/2020   Lab Results  Component Value Date   MICROALBUR 1.8 03/14/2022   LDLCALC 111 (H) 03/14/2022    CREATININE 0.96 03/14/2022    No results found for: "FRUCTOSAMINE"   Allergies as of 03/20/2022   No Known Allergies      Medication List        Accurate as of March 20, 2022 11:51 AM. If you have any questions, ask your nurse or doctor.          albuterol 108 (90 Base) MCG/ACT inhaler Commonly known as: VENTOLIN HFA Inhale 2 puffs into the lungs every 6 (six) hours as needed for wheezing.   amLODipine-benazepril 10-20 MG capsule Commonly known as: LOTREL Take 1 capsule by mouth daily.   aspirin 81 MG tablet Take 81 mg by mouth daily.   atorvastatin 20 MG tablet Commonly known as: LIPITOR Take 1 tablet by mouth daily.   Farxiga 5 MG Tabs tablet Generic drug: dapagliflozin propanediol TAKE 1 TABLET(5 MG) BY MOUTH DAILY   metFORMIN 500 MG 24 hr tablet Commonly known as: GLUCOPHAGE-XR Take 4 tablets (2,000 mg total) by mouth daily.   milk thistle 175 MG tablet Take 175 mg by mouth daily.   multivitamin tablet Take 1 tablet by mouth daily.        Allergies: No Known Allergies  Past Medical History:  Diagnosis  Date   Asthma    Asthma 10/22/2012   Diabetes mellitus without complication ([place])    Dyslipidemia 10/22/2012   Hypertension     History reviewed. No pertinent surgical history.  Family History  Problem Relation Age of Onset   Clotting disorder Mother    Diabetes Mother    Hypertension Mother    Hypertension Father    Stroke Brother     Social History:  reports that he has never smoked. He has never used smokeless tobacco. He reports current alcohol use. He reports that he does not use drugs.  Review of Systems:  Last diabetic eye exam date 3/32  Last urine microalbumin date: 9/23  Last foot exam date: 9/23  Symptoms of neuropathy: None Has no history of erectile dysfunction  Hypertension: Treated by PCP ACE/ARB medication: Benazepril  BP Readings from Last 3 Encounters:  03/20/22 138/82  08/30/20 130/80  02/07/20 (!)  149/93    Lipid management: Treated by PCP with atorvastatin, previous labs not available    Lab Results  Component Value Date   CHOL 176 03/14/2022   Lab Results  Component Value Date   HDL 36.90 (L) 03/14/2022   Lab Results  Component Value Date   LDLCALC 111 (H) 03/14/2022   Lab Results  Component Value Date   TRIG 141.0 03/14/2022   Lab Results  Component Value Date   CHOLHDL 5 03/14/2022   No results found for: "LDLDIRECT"  Unable to calculate Fibrosis 4 Score. Requires ALT, AST, and platelet count within the last 6 months.      Examination:   BP 138/82   Pulse 84   Ht 5\' 6"  (1.676 m)   Wt 174 lb 12.8 oz (79.3 kg)   SpO2 95%   BMI 28.21 kg/m   Body mass index is 28.21 kg/m.    ASSESSMENT/ PLAN:    Diabetes type 2:   Current regimen: Farxiga 5 mg daily, metformin ER 2 g daily  See history of present illness for detailed discussion of current diabetes management, blood sugar patterns and problems identified  A1c is 7.7 and higher than usual Discussed that his A1c is indicating an average blood sugar of about 175 and he likely has high postprandial reading Currently not monitoring blood sugars regularly and likely none after meals His diet and exercise regimen appears to be fairly good Appears to be having mild diarrhea from full doses of metformin  Recommendations:  Increase Farxiga dose to 10 mg; since he just had his 5 mg tablets refill he can take 2 together To request 10 mg dose when he is finishing current prescription He can try taking 3 metformin's a day instead of 2 and take all 3 at dinnertime If tolerated may be able to move up to 4 tablets daily or use [employer] checking blood sugars consistently after meals and call if consistently over 160 Discussed that if blood sugars are consistently out of control he may need to start the medication Consultation with nutritionist for meal planning review and general diabetes education Meanwhile  encouraged him to add a protein such as a boiled egg or low or low-fat meat product with breakfast Bring blood sugar monitor on next visit for review  HYPERCHOLESTEROLEMIA: Discussed LDL target of under 100 and he is not achieving this with 20 mg atorvastatin To forward the results of LDL to PCP for further management  Follow-up in 3 months  Patient Instructions  Check blood sugars on waking up days 2-3  a week  Also check blood sugars about 2 hours after meals and do this after different meals by rotation  Recommended blood sugar levels on waking up are 90-130 and about 2 hours after meal is 130-160  Please bring your blood sugar monitor to each visit, thank you  Take 3 Metformin at dinner and if no diarrhea try 4          Jon Vega 03/20/2022, 11:51 AM

## 2022-03-20 NOTE — Patient Instructions (Addendum)
Check blood sugars on waking up days 2-3 a week  Also check blood sugars about 2 hours after meals and do this after different meals by rotation  Recommended blood sugar levels on waking up are 90-130 and about 2 hours after meal is 130-160  Please bring your blood sugar monitor to each visit, thank you  Take 3 Metformin at dinner and if no diarrhea try 4 tabs

## 2022-05-09 ENCOUNTER — Other Ambulatory Visit: Payer: Self-pay

## 2022-05-09 DIAGNOSIS — E1169 Type 2 diabetes mellitus with other specified complication: Secondary | ICD-10-CM

## 2022-05-09 MED ORDER — DAPAGLIFLOZIN PROPANEDIOL 5 MG PO TABS: 10.0000 mg | ORAL_TABLET | Freq: Every day | ORAL | 1 refills | Status: DC

## 2022-06-30 ENCOUNTER — Other Ambulatory Visit (INDEPENDENT_AMBULATORY_CARE_PROVIDER_SITE_OTHER): Payer: BC Managed Care – PPO

## 2022-06-30 DIAGNOSIS — E1165 Type 2 diabetes mellitus with hyperglycemia: Secondary | ICD-10-CM

## 2022-06-30 LAB — BASIC METABOLIC PANEL
BUN: 18 mg/dL (ref 6–23)
CO2: 26 mEq/L (ref 19–32)
Calcium: 9.4 mg/dL (ref 8.4–10.5)
Chloride: 101 mEq/L (ref 96–112)
Creatinine, Ser: 0.98 mg/dL (ref 0.40–1.50)
GFR: 85.74 mL/min (ref >60.00)
Glucose, Bld: 140 mg/dL — ABNORMAL HIGH (ref 70–99)
Potassium: 3.8 mEq/L (ref 3.5–5.1)
Sodium: 137 mEq/L (ref 135–145)

## 2022-06-30 LAB — HEMOGLOBIN A1C: Hgb A1c MFr Bld: 7.4 % — ABNORMAL HIGH (ref 4.6–6.5)

## 2022-07-04 ENCOUNTER — Ambulatory Visit: Payer: BC Managed Care – PPO | Admitting: Endocrinology

## 2022-07-07 ENCOUNTER — Encounter: Payer: Self-pay | Admitting: Endocrinology

## 2022-07-07 ENCOUNTER — Telehealth: Payer: Self-pay | Admitting: Endocrinology

## 2022-07-07 ENCOUNTER — Ambulatory Visit (INDEPENDENT_AMBULATORY_CARE_PROVIDER_SITE_OTHER): Payer: BC Managed Care – PPO | Admitting: Endocrinology

## 2022-07-07 VITALS — BP 136/84 | HR 91 | Ht 66.0 in | Wt 178.2 lb

## 2022-07-07 DIAGNOSIS — E1165 Type 2 diabetes mellitus with hyperglycemia: Secondary | ICD-10-CM | POA: Diagnosis not present

## 2022-07-07 MED ORDER — ONETOUCH DELICA LANCETS 33G MISC: 3 refills | Status: AC

## 2022-07-07 MED ORDER — DAPAGLIFLOZIN PROPANEDIOL 10 MG PO TABS: 10.0000 mg | ORAL_TABLET | Freq: Every day | ORAL | 1 refills | Status: DC

## 2022-07-07 MED ORDER — ONETOUCH VERIO VI STRP: ORAL_STRIP | 3 refills | Status: DC

## 2022-07-07 NOTE — Progress Notes (Signed)
Patient ID: Jon Vega, male   DOB: [birth year], 57 y.o.   MRN: [id number]           Reason for Appointment: Type II Diabetes follow-up   History of Present Illness   Diagnosis date: 2017  Previous history:  Non-insulin hypoglycemic drugs previously used: Metformin, Wilder Glade He has only been seen once in consultation previously last year  A1c before his initial consultation was 7.5  Recent history:     Non-insulin hypoglycemic drugs: Farxiga 10mg , metformin ER 1500 mg daily        Side effects from medications: None  Current self management, blood sugar patterns and problems identified:  A1c is 7.4, was 7.7 He is checking blood sugars primarily fasting and does not do any monitoring later in the day despite reminders Also his Wilder Glade was increased on his last visit in 9/23  Metformin was reduced to 3 tablets because of tendency to diarrhea with 4 tablets Lab glucose was 140 fasting but done late morning He also ran out of test trips last month and not clear what his blood sugars are, previously highest 145 and lowest about 95 but generally in the low 100 range He thinks his blood sugars are better with more consistent diet and higher with higher carbohydrate or high fat meal the night before  He is not doing any formal exercise He is concerned that his sugars are not better controlled despite trying to improve his diet Weight is slightly higher than last visit  Exercise: He does try to walk at work  Diet management: He is avoiding fried food, sweet drinks and red meat; he thinks he is cutting back on rice portions Usually only eating toast in the morning for breakfast, usually will get some protein like chicken or fish at lunch and dinner     Hypoglycemia:  none    Glucometer: Verio He does not think he is using expired test trips but no recent prescription on file  Blood Glucose readings as above    Dietician visit: Most recent:    None  Weight control:  Wt  Readings from Last 3 Encounters:  07/07/22 178 lb 3.2 oz (80.8 kg)  03/20/22 174 lb 12.8 oz (79.3 kg)  08/30/20 173 lb 6.4 oz (78.7 kg)            Diabetes labs:  Lab Results  Component Value Date   HGBA1C 7.4 (H) 06/30/2022   HGBA1C 7.7 (H) 03/14/2022   HGBA1C 6.7 (A) 08/30/2020   Lab Results  Component Value Date   MICROALBUR 1.8 03/14/2022   LDLCALC 111 (H) 03/14/2022   CREATININE 0.98 06/30/2022    No results found for: "FRUCTOSAMINE"   Allergies as of 07/07/2022   No Known Allergies      Medication List        Accurate as of July 07, 2022 11:56 AM. If you have any questions, ask your nurse or doctor.          albuterol 108 (90 Base) MCG/ACT inhaler Commonly known as: VENTOLIN HFA Inhale 2 puffs into the lungs every 6 (six) hours as needed for wheezing.   amLODipine-benazepril 10-20 MG capsule Commonly known as: LOTREL Take 1 capsule by mouth daily.   aspirin 81 MG tablet Take 81 mg by mouth daily.   atorvastatin 20 MG tablet Commonly known as: LIPITOR Take 1 tablet by mouth daily.   dapagliflozin propanediol 5 MG Tabs tablet Commonly known as: Farxiga Take 2 tablets (10 mg total) by  mouth daily.   metFORMIN 500 MG 24 hr tablet Commonly known as: GLUCOPHAGE-XR Take 4 tablets (2,000 mg total) by mouth daily.   milk thistle 175 MG tablet Take 175 mg by mouth daily.   multivitamin tablet Take 1 tablet by mouth daily.        Allergies: No Known Allergies  Past Medical History:  Diagnosis Date   Asthma    Asthma 10/22/2012   Diabetes mellitus without complication ([place])    Dyslipidemia 10/22/2012   Hypertension     No past surgical history on file.  Family History  Problem Relation Age of Onset   Clotting disorder Mother    Diabetes Mother    Hypertension Mother    Hypertension Father    Stroke Brother     Social History:  reports that he has never smoked. He has never used smokeless tobacco. He reports current alcohol use.  He reports that he does not use drugs.  Review of Systems:  Last diabetic eye exam date 3/23  Last urine microalbumin date: 9/23  Last foot exam date: 9/23  Symptoms of neuropathy: None Has no history of erectile dysfunction  Hypertension: Treated by PCP ACE/ARB medication: Benazepril  BP Readings from Last 3 Encounters:  07/07/22 136/84  03/20/22 138/82  08/30/20 130/80    Lipid management: Treated by PCP with atorvastatin 20 mg, previous labs not available    Lab Results  Component Value Date   CHOL 176 03/14/2022   Lab Results  Component Value Date   HDL 36.90 (L) 03/14/2022   Lab Results  Component Value Date   LDLCALC 111 (H) 03/14/2022   Lab Results  Component Value Date   TRIG 141.0 03/14/2022   Lab Results  Component Value Date   CHOLHDL 5 03/14/2022   No results found for: "LDLDIRECT"  Unable to calculate Fibrosis 4 Score. Requires ALT, AST, and platelet count within the last 6 months.      Examination:   BP 136/84   Pulse 91   Ht 5\' 6"  (1.676 m)   Wt 178 lb 3.2 oz (80.8 kg)   SpO2 96%   BMI 28.76 kg/m   Body mass index is 28.76 kg/m.    ASSESSMENT/ PLAN:    Diabetes type 2:   Current regimen: Farxiga 10 mg daily, metformin ER 2 g daily  See history of present illness for detailed discussion of current diabetes management, blood sugar patterns and problems identified  A1c is 7.4  A1c is improving somewhat with increased [name] and reportedly better diet However has gained weight Still likely has high postprandial readings at times Can benefit from nutritional counseling and he agrees to do this Also discussed postprandial blood sugar targets  Recommendations:  As above he will see the nutritionist for counseling Start aerobic exercise regularly Check readings at least every other day after meals at night and can do morning readings every other day Discussed that fasting readings will be more difficult to bring to normal  range because of his insulin resistance If not improved on the next visit may consider a GLP-1 drug Bring blood sugar monitor for download or connect to the One Touch reveal system for cloud linkage  HYPERCHOLESTEROLEMIA: Followed by PCP  Follow-up in 3 months  There are no Patient Instructions on file for this visit.   Elayne Snare 07/07/2022, 11:56 AM

## 2022-07-10 ENCOUNTER — Telehealth: Payer: Self-pay

## 2022-07-10 NOTE — Telephone Encounter (Signed)
Jon Vega not covered by insurance. Alternative requested

## 2022-07-10 NOTE — Telephone Encounter (Signed)
Farxiga not covered by insurance. Alternative requested  

## 2022-07-11 ENCOUNTER — Other Ambulatory Visit (HOSPITAL_COMMUNITY): Payer: Self-pay

## 2022-07-11 NOTE — Telephone Encounter (Signed)
[employer] would require a prior authorization. Vania Rea is a co-pay of $25.00

## 2022-07-13 ENCOUNTER — Other Ambulatory Visit: Payer: Self-pay

## 2022-07-13 MED ORDER — EMPAGLIFLOZIN 25 MG PO TABS: 25.0000 mg | ORAL_TABLET | Freq: Every day | ORAL | 1 refills | Status: DC

## 2022-07-13 NOTE — Telephone Encounter (Signed)
Jon Vega has been sent to pharmacy per Dr. Dwyane Dee to replace [name].

## 2022-07-17 ENCOUNTER — Other Ambulatory Visit (HOSPITAL_COMMUNITY): Payer: Self-pay

## 2022-08-24 ENCOUNTER — Encounter: Payer: BC Managed Care – PPO | Attending: Endocrinology | Admitting: Dietician

## 2022-08-24 ENCOUNTER — Encounter: Payer: Self-pay | Admitting: Dietician

## 2022-08-24 VITALS — Ht 66.0 in | Wt 171.0 lb

## 2022-08-24 DIAGNOSIS — E119 Type 2 diabetes mellitus without complications: Secondary | ICD-10-CM | POA: Insufficient documentation

## 2022-08-24 NOTE — Patient Instructions (Addendum)
Continue to be active most days.  Great job on finding things that are enjoyable to you.  Consider a walk in your neighborhood after dinner.  Continue to check blood glucose one hour before meal and/or 2 hours after meals.  For snack pairing protein with carbohydrate. When looking at food label checking for a portion size. If having alcohol, eat something with it.  Take 1 tablespoon honey, 1/2 cup juice, or regular soda if you are having low glucose reading.   Aim for 3 Carb Choices per meal (45 grams) +/- 1 either way  Aim for 0-1 Carbs per snack if hungry  Include protein in moderation with your meals and snacks Consider reading food labels for Total Carbohydrate of foods Consider taking medication as directed by MD

## 2022-08-24 NOTE — Progress Notes (Signed)
Diabetes Self-Management Education  Visit Type: First/Initial  Appt. Start Time: 1550 Appt. End Time: 1700  08/24/2022  [name], identified by name and date of birth, is a 57 y.o. male with a diagnosis of Diabetes: Type 2.   ASSESSMENT  Patient is here today alone. He would like to get better control of his diabetes.  Referral dx:  Type 2 Diabetes  History includes:Diabetes type 2, Asthma, HTN Labs noted to include:  A1C 7.4% 06/30/2022 decreased from 7.7% 03/14/2022. Medications include:  Jardiance, Metformin  Weight history: 66 inches.  171 lbs 08/24/2022 178 lb 07/07/2022   Patient lives alone. He does his own shopping and cooking. 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  Day shift. Enjoys gardening, volleyball Gym 2-3 times per week for 30 minutes - elliptical   Height '5\' 6"'$  (1.676 m), weight 171 lb (77.6 kg). Body mass index is 27.6 kg/m.   Diabetes Self-Management Education - 08/24/22 1602       Visit Information   Visit Type First/Initial      Initial Visit   Diabetes Type Type 2    Date Diagnosed 2019    Are you currently following a meal plan? No    Are you taking your medications as prescribed? Yes      Health Coping   How would you rate your overall health? Fair      Psychosocial Assessment   Patient Belief/Attitude about Diabetes Motivated to manage diabetes   frustrated   What is the hardest part about your diabetes right now, causing you the most concern, or is the most worrisome to you about your diabetes?   Being active    Self-care barriers None    Self-management support Doctor's office    Other persons present Patient    Patient Concerns Nutrition/Meal planning;Glycemic Control    Special Needs None    Learning Readiness Ready    How often do you need to have someone help you when you read instructions, pamphlets, or other written materials from your doctor or pharmacy? 1 - Never    What is the last grade level you  completed in school? college grade      Pre-Education Assessment   Patient understands the diabetes disease and treatment process. Needs Instruction    Patient understands incorporating nutritional management into lifestyle. Needs Instruction    Patient undertands incorporating physical activity into lifestyle. Needs Instruction    Patient understands using medications safely. Needs Instruction    Patient understands monitoring blood glucose, interpreting and using results Needs Instruction    Patient understands prevention, detection, and treatment of acute complications. Needs Instruction    Patient understands prevention, detection, and treatment of chronic complications. Needs Instruction    Patient understands how to develop strategies to address psychosocial issues. Needs Instruction    Patient understands how to develop strategies to promote health/change behavior. Needs Instruction      Complications   Last HgB A1C per patient/outside source 7.4 %    How often do you check your blood sugar? 1-2 times/day    Fasting Blood glucose range (mg/dL) 70-129    Postprandial Blood glucose range (mg/dL) 70-129;130-179    Number of hypoglycemic episodes per month 0    Number of hyperglycemic episodes ( >'200mg'$ /dL): Never    Can you tell when your blood sugar is high? No    Have you had a dilated eye exam in the past 12 months? Yes    Have you  had a dental exam in the past 12 months? No    Are you checking your feet? Yes    How many days per week are you checking your feet? 5      Dietary Intake   Breakfast Coffee with 1 tsp of honey, 2 tsp of coconut oil and peanut butter, bread. lactate    Lunch Ham sandwich    Snack (afternoon) black coffee    Dinner Chicken breast, vegetabbles    Snack (evening) popcorn    Beverage(s) carbonated water, sprankling water, coffee, water with vinager and lemon,green tea      Activity / Exercise   Activity / Exercise Type Light (walking / raking leaves)     How many days per week do you exercise? 3    How many minutes per day do you exercise? 30    Total minutes per week of exercise 90      Patient Education   Previous Diabetes Education No    Disease Pathophysiology Definition of diabetes, type 1 and 2, and the diagnosis of diabetes;Factors that contribute to the development of diabetes;Explored patient's options for treatment of their diabetes    Healthy Eating Role of diet in the treatment of diabetes and the relationship between the three main macronutrients and blood glucose level;Plate Method    Being Active Helped patient identify appropriate exercises in relation to his/her diabetes, diabetes complications and other health issue.;Role of exercise on diabetes management, blood pressure control and cardiac health.    Monitoring Purpose and frequency of SMBG.;Daily foot exams;Yearly dilated eye exam    Acute complications Taught prevention, symptoms, and  treatment of hypoglycemia - the 15 rule.    Chronic complications Dental care;Retinopathy and reason for yearly dilated eye exams;Nephropathy, what it is, prevention of, the use of ACE, ARB's and early detection of through urine microalbumia.;Relationship between chronic complications and blood glucose control;Assessed and discussed foot care and prevention of foot problems;Lipid levels, blood glucose control and heart disease      Individualized Goals (developed by patient)   Nutrition Follow meal plan discussed;General guidelines for healthy choices and portions discussed    Physical Activity Exercise 5-7 days per week;60 minutes per day    Medications take my medication as prescribed    Monitoring  Test my blood glucose as discussed      Post-Education Assessment   Patient understands the diabetes disease and treatment process. Demonstrates understanding / competency    Patient understands incorporating nutritional management into lifestyle. Demonstrates understanding / competency     Patient undertands incorporating physical activity into lifestyle. Demonstrates understanding / competency    Patient understands using medications safely. Demonstrates understanding / competency    Patient understands monitoring blood glucose, interpreting and using results Demonstrates understanding / competency    Patient understands prevention, detection, and treatment of acute complications. Demonstrates understanding / competency    Patient understands prevention, detection, and treatment of chronic complications. Demonstrates understanding / competency    Patient understands how to develop strategies to address psychosocial issues. Demonstrates understanding / competency    Patient understands how to develop strategies to promote health/change behavior. Demonstrates understanding / competency      Outcomes   Expected Outcomes Demonstrated interest in learning. Expect positive outcomes             Individualized Plan for Diabetes Self-Management Training:   Learning Objective:  Patient will have a greater understanding of diabetes self-management. Patient education plan is to attend individual and/or group  sessions per assessed needs and concerns.   Plan:   Patient Instructions  Continue to be active most days.  Great job on finding things that are enjoyable to you.  Consider a walk in your neighborhood after dinner.  Continue to check blood glucose one hour before meal and/or 2 hours after meals.  For snack pairing protein with carbohydrate. When looking at food label checking for a portion size. If having alcohol, eat something with it.  Take 1 tablespoon honey, 1/2 cup juice, or regular soda if you are having low glucose reading.   Aim for 3 Carb Choices per meal (45 grams) +/- 1 either way  Aim for 0-1 Carbs per snack if hungry  Include protein in moderation with your meals and snacks Consider reading food labels for Total Carbohydrate of foods Consider taking  medication as directed by MD       Expected Outcomes:  Demonstrated interest in learning. Expect positive outcomes  Education material provided: ADA - How to Thrive: A Guide for Your Journey with Diabetes, Food label handouts, Meal plan card, and Diabetes Resources  If problems or questions, patient to contact team via:  Phone  Future DSME appointment:

## 2022-09-13 ENCOUNTER — Other Ambulatory Visit: Payer: Self-pay

## 2022-09-13 DIAGNOSIS — E1165 Type 2 diabetes mellitus with hyperglycemia: Secondary | ICD-10-CM

## 2022-09-13 MED ORDER — ONETOUCH VERIO VI STRP: ORAL_STRIP | 6 refills | Status: AC

## 2022-10-12 ENCOUNTER — Other Ambulatory Visit (INDEPENDENT_AMBULATORY_CARE_PROVIDER_SITE_OTHER): Payer: BC Managed Care – PPO

## 2022-10-12 DIAGNOSIS — E1165 Type 2 diabetes mellitus with hyperglycemia: Secondary | ICD-10-CM | POA: Diagnosis not present

## 2022-10-12 LAB — BASIC METABOLIC PANEL
BUN: 22 mg/dL (ref 6–23)
CO2: 27 mEq/L (ref 19–32)
Calcium: 9.4 mg/dL (ref 8.4–10.5)
Chloride: 102 mEq/L (ref 96–112)
Creatinine, Ser: 1.08 mg/dL (ref 0.40–1.50)
GFR: 76.15 mL/min (ref >60.00)
Glucose, Bld: 126 mg/dL — ABNORMAL HIGH (ref 70–99)
Potassium: 3.9 mEq/L (ref 3.5–5.1)
Sodium: 138 mEq/L (ref 135–145)

## 2022-10-12 LAB — HEMOGLOBIN A1C: Hgb A1c MFr Bld: 6.6 % — ABNORMAL HIGH (ref 4.6–6.5)

## 2022-10-16 ENCOUNTER — Encounter: Payer: Self-pay | Admitting: Endocrinology

## 2022-10-16 ENCOUNTER — Ambulatory Visit: Payer: BC Managed Care – PPO | Admitting: Endocrinology

## 2022-10-16 VITALS — BP 128/86 | HR 94 | Ht 66.0 in | Wt 177.8 lb

## 2022-10-16 DIAGNOSIS — E119 Type 2 diabetes mellitus without complications: Secondary | ICD-10-CM

## 2022-10-16 NOTE — Progress Notes (Signed)
Patient ID: Jon Vega, male   DOB: Mar 25, 1966, 57 y.o.   MRN: [id number]           Reason for Appointment: Type II Diabetes follow-up   History of Present Illness   Diagnosis date: 2017  Previous history:  Non-insulin hypoglycemic drugs previously used: Metformin, Marcelline Deist He has only been seen once in consultation previously last year  A1c before his initial consultation was 7.5  Recent history:     Non-insulin hypoglycemic drugs: Jardiance 25 mg daily, metformin ER 1500 mg daily        Side effects from medications: None  Current self management, blood sugar patterns and problems identified:  A1c is 6.6 compared to 7.4, was 7.7 previously He is checking blood sugars again only fasting and does not do any testing later in the day despite reminders Also his Marcelline Deist was changed after his last visit to Jardiance 25 mg daily because of insurance preference He also takes 1500 mg metformin daily which does not cause diarrhea Just after starting his Jardiance he felt weak and dizzy and some shakiness midmorning at work relieved by food but has not had any further episodes and not clear what his blood sugar was His blood sugars are excellent and averaging 118 at home, did not have his monitor for download on the last visit He is also trying to modify his diet with adding more fruits and vegetables and cutting back on carbohydrates and portions after seeing the dietitian about 8 weeks ago He is going to the [facility] 2-3 times a week for exercise No side effects from Jardiance Weight is about the same as a the last visit  Exercise: Somewhat pain at work, going to the [employer]   Diet management: He is avoiding fried food, sweet drinks and red meat; he thinks he is cutting back on rice portions Usually only eating egg with toast in the morning for breakfast, usually will get some protein like chicken or fish at lunch and dinner     Glucometer: Verio   PRE-MEAL Fasting Lunch Dinner Bedtime  Overall  Glucose range: 92-143      Mean/median: 118        Dietician visit: Most recent:    2/24  Weight control:  Wt Readings from Last 3 Encounters:  10/16/22 177 lb 12.8 oz (80.6 kg)  08/24/22 171 lb (77.6 kg)  07/07/22 178 lb 3.2 oz (80.8 kg)           Diabetes labs:  Lab Results  Component Value Date   HGBA1C 6.6 (H) 10/12/2022   HGBA1C 7.4 (H) 06/30/2022   HGBA1C 7.7 (H) 03/14/2022   Lab Results  Component Value Date   MICROALBUR 1.8 03/14/2022   LDLCALC 111 (H) 03/14/2022   CREATININE 1.08 10/12/2022    No results found for: "FRUCTOSAMINE"   Allergies as of 10/16/2022   No Known Allergies      Medication List        Accurate as of October 16, 2022  3:35 PM. If you have any questions, ask your nurse or doctor.          albuterol 108 (90 Base) MCG/ACT inhaler Commonly known as: VENTOLIN HFA Inhale 2 puffs into the lungs every 6 (six) hours as needed for wheezing.   amLODipine-benazepril 10-20 MG capsule Commonly known as: LOTREL Take 1 capsule by mouth daily.   aspirin 81 MG tablet Take 81 mg by mouth daily.   atorvastatin 20 MG tablet Commonly known as: LIPITOR  Take 1 tablet by mouth daily.   BITTER MELON PO Take by mouth.   dapagliflozin propanediol 10 MG Tabs tablet Commonly known as: Farxiga Take 1 tablet (10 mg total) by mouth daily before breakfast.   empagliflozin 25 MG Tabs tablet Commonly known as: Jardiance Take 1 tablet (25 mg total) by mouth daily before breakfast.   hydrochlorothiazide 12.5 MG tablet Commonly known as: HYDRODIURIL Take 12.5 mg by mouth daily.   metFORMIN 500 MG 24 hr tablet Commonly known as: GLUCOPHAGE-XR Take 4 tablets (2,000 mg total) by mouth daily. What changed: how much to take   milk thistle 175 MG tablet Take 175 mg by mouth daily.   multivitamin tablet Take 1 tablet by mouth daily.   OneTouch Delica Lancets 33G Misc Use to check blood sugar once a day   OneTouch Verio test  strip Generic drug: glucose blood Use to check blood sugar once a day        Allergies: No Known Allergies  Past Medical History:  Diagnosis Date   Asthma    Asthma 10/22/2012   Diabetes mellitus without complication    Dyslipidemia 10/22/2012   Hypertension     No past surgical history on file.  Family History  Problem Relation Age of Onset   Clotting disorder Mother    Diabetes Mother    Hypertension Mother    Hypertension Father    Stroke Brother     Social History:  reports that he has never smoked. He has never used smokeless tobacco. He reports current alcohol use. He reports that he does not use drugs.  Review of Systems:  Last diabetic eye exam date 3/23  Last urine microalbumin date: 9/23  Last foot exam date: 9/23 by PCP  Symptoms of neuropathy: None Has no history of erectile dysfunction  Hypertension: Treated by PCP, appears well-controlled  ACE/ARB medication: Benazepril  BP Readings from Last 3 Encounters:  10/16/22 128/86  07/07/22 136/84  03/20/22 138/82    Lipid management: Treated by PCP with atorvastatin 20 mg    Lab Results  Component Value Date   CHOL 176 03/14/2022   Lab Results  Component Value Date   HDL 36.90 (L) 03/14/2022   Lab Results  Component Value Date   LDLCALC 111 (H) 03/14/2022   Lab Results  Component Value Date   TRIG 141.0 03/14/2022   Lab Results  Component Value Date   CHOLHDL 5 03/14/2022   No results found for: "LDLDIRECT"  Unable to calculate Fibrosis 4 Score. Requires ALT, AST, and platelet count within the last 6 months.      Examination:   BP 128/86 (BP Location: Left Arm, Patient Position: Sitting, Cuff Size: Normal)   Pulse 94   Ht  (1.676 m)   Wt 177 lb 12.8 oz (80.6 kg)   SpO2 97%   BMI 28.70 kg/m   Body mass index is 28.7 kg/m.    ASSESSMENT/ PLAN:    Diabetes type 2:   Current regimen: Jardiance 25 mg daily, metformin ER 2 g daily  See history of present illness  for detailed discussion of current diabetes management, blood sugar patterns and problems identified  A1c is 6.6  A1c is improving significantly with lifestyle changes as above He is doing well with 1500 mg metformin and Jardiance Also benefiting from seeing the dietitian and exercising since his last visit However not clear what his postprandial readings are Reminded him about starting to do postprandial blood sugar readings at least  every other time and discussed blood sugar targets   HYPERCHOLESTEROLEMIA: Followed by PCP, needs to have LDL below 130 least  Follow-up in 3 months  There are no Patient Instructions on file for this visit.   Reather Littler 10/16/2022, 3:35 PM

## 2022-10-16 NOTE — Patient Instructions (Signed)
Check blood sugars on waking up 2 days a week  Also check blood sugars about 2 hours after meals and do this after different meals by rotation  Recommended blood sugar levels on waking up are 90-130 and about 2 hours after meal is 130-160  Please bring your blood sugar monitor to each visit, thank you   

## 2022-12-14 ENCOUNTER — Encounter: Payer: Self-pay | Admitting: Endocrinology

## 2022-12-14 DIAGNOSIS — E1165 Type 2 diabetes mellitus with hyperglycemia: Secondary | ICD-10-CM

## 2022-12-14 MED ORDER — EMPAGLIFLOZIN 25 MG PO TABS: 25.0000 mg | ORAL_TABLET | Freq: Every day | ORAL | 1 refills | Status: DC

## 2023-02-08 ENCOUNTER — Other Ambulatory Visit: Payer: BC Managed Care – PPO

## 2023-02-15 ENCOUNTER — Ambulatory Visit: Payer: BC Managed Care – PPO | Admitting: Endocrinology

## 2023-03-23 ENCOUNTER — Other Ambulatory Visit: Payer: Self-pay | Admitting: Family Medicine

## 2023-03-23 DIAGNOSIS — Z8249 Family history of ischemic heart disease and other diseases of the circulatory system: Secondary | ICD-10-CM

## 2023-03-28 ENCOUNTER — Ambulatory Visit
Admission: RE | Admit: 2023-03-28 | Discharge: 2023-03-28 | Disposition: A | Discharge status: Home / Self Care | Payer: BC Managed Care – PPO | Source: Ambulatory Visit | Attending: Family Medicine | Admitting: Family Medicine

## 2023-03-28 DIAGNOSIS — Z8249 Family history of ischemic heart disease and other diseases of the circulatory system: Secondary | ICD-10-CM

## 2023-03-28 MED ORDER — IOPAMIDOL (ISOVUE-370) INJECTION 76%
100.0000 mL | Freq: Once | INTRAVENOUS | Status: AC | PRN
  Administered 2023-03-28: 75 mL via INTRAVENOUS

## 2023-04-16 ENCOUNTER — Other Ambulatory Visit (INDEPENDENT_AMBULATORY_CARE_PROVIDER_SITE_OTHER): Payer: BC Managed Care – PPO

## 2023-04-16 DIAGNOSIS — E119 Type 2 diabetes mellitus without complications: Secondary | ICD-10-CM

## 2023-04-16 LAB — BASIC METABOLIC PANEL
BUN: 25 mg/dL — ABNORMAL HIGH (ref 6–23)
CO2: 26 meq/L (ref 19–32)
Calcium: 9.4 mg/dL (ref 8.4–10.5)
Chloride: 99 meq/L (ref 96–112)
Creatinine, Ser: 1.12 mg/dL (ref 0.40–1.50)
GFR: 72.64 mL/min (ref >60.00)
Glucose, Bld: 135 mg/dL — ABNORMAL HIGH (ref 70–99)
Potassium: 3.4 meq/L — ABNORMAL LOW (ref 3.5–5.1)
Sodium: 136 meq/L (ref 135–145)

## 2023-04-16 LAB — MICROALBUMIN / CREATININE URINE RATIO
Creatinine,U: 99.3 mg/dL
Microalb Creat Ratio: 1.2 mg/g (ref 0.0–30.0)
Microalb, Ur: 1.2 mg/dL (ref 0.0–1.9)

## 2023-04-16 LAB — HEMOGLOBIN A1C: Hgb A1c MFr Bld: 7.1 % — ABNORMAL HIGH (ref 4.6–6.5)

## 2023-04-23 ENCOUNTER — Ambulatory Visit (INDEPENDENT_AMBULATORY_CARE_PROVIDER_SITE_OTHER): Payer: BC Managed Care – PPO | Admitting: Endocrinology

## 2023-04-23 ENCOUNTER — Encounter: Payer: Self-pay | Admitting: Endocrinology

## 2023-04-23 VITALS — BP 142/80 | HR 81 | Resp 20 | Ht 66.0 in | Wt 177.0 lb

## 2023-04-23 DIAGNOSIS — E876 Hypokalemia: Secondary | ICD-10-CM

## 2023-04-23 DIAGNOSIS — E119 Type 2 diabetes mellitus without complications: Secondary | ICD-10-CM

## 2023-04-23 DIAGNOSIS — Z7984 Long term (current) use of oral hypoglycemic drugs: Secondary | ICD-10-CM

## 2023-04-23 LAB — POTASSIUM: Potassium: 4.3 meq/L (ref 3.5–5.1)

## 2023-04-23 NOTE — Patient Instructions (Signed)
Diabetes regimen: Take Metformin XR 500 mg 4 tabs daily, better to take with food. Jardiance 25mg  daily.

## 2023-04-23 NOTE — Progress Notes (Signed)
Outpatient Endocrinology Note [place] Jood Retana, MD  04/23/23  Patient's Name: Jon Vega    DOB: 1965/07/02    MRN: [id number]                                                    REASON OF VISIT: Follow up of type 2 diabetes mellitus  PCP: Docia Chuck Dibas, MD  HISTORY OF PRESENT ILLNESS:   Donterius Ridolfi is a 57 y.o. old male with past medical history listed below, is here for follow up for type 2 diabetes mellitus.  Patient was last seen by Dr. Lucianne Muss in April 2024.  Pertinent Diabetes History: Patient was diagnosed with type 2 diabetes mellitus in 2017.  Chronic Diabetes Complications : Retinopathy: no. Last ophthalmology exam was done on due.  Nephropathy: no, on benazepril. Peripheral neuropathy: no Coronary artery disease: no Stroke: no  Relevant comorbidities and cardiovascular risk factors: Obesity: no Body mass index is 28.57 kg/m.  Hypertension: Yes  Hyperlipidemia : Yes, on statin   Current / Home Diabetic regimen includes: Jardiance 25 mg daily. Metformin XR 1500 mg daily.  Prior diabetic medications: Farxiga changed to [place] due to insurance preference.  Glycemic data:   One Touch Verio Flex glucometer.  Glucose data reviewed from glucometer download and logbook.  He has been taking in the morning fasting, glucose are as follows : 127, 118, 103, 109, 131, 104, 105, 100, 95, 106, 89, 112, 104, 120, 103.    Hypoglycemia: Patient has no hypoglycemic episodes. Patient has hypoglycemia awareness.  Factors modifying glucose control: 1.  Diabetic diet assessment: 2-3 meals a day.   2.  Staying active or exercising: [employer] 2-3 times a week.   3.  Medication compliance: compliant all of the time.  Interval history  Glucometer data as reviewed above.  Recent hemoglobin A1c 7.1%.  On the recent lab BMP overall stable renal function however had potassium 3.4 slightly low.  Denies numbness and tingling of the feet.  No vision problem.  No other complaints today.  REVIEW  OF SYSTEMS As per history of present illness.   PAST MEDICAL HISTORY: Past Medical History:  Diagnosis Date   Asthma    Asthma 10/22/2012   Diabetes mellitus without complication (HCC)    Dyslipidemia 10/22/2012   Hypertension     PAST SURGICAL HISTORY: History reviewed. No pertinent surgical history.  ALLERGIES: No Known Allergies  FAMILY HISTORY:  Family History  Problem Relation Age of Onset   Clotting disorder Mother    Diabetes Mother    Hypertension Mother    Hypertension Father    Stroke Brother     SOCIAL HISTORY: Social History   Socioeconomic History   Marital status: Married    Spouse name: Not on file   Number of children: Not on file   Years of education: Not on file   Highest education level: Not on file  Occupational History   Not on file  Tobacco Use   Smoking status: Never   Smokeless tobacco: Never  Vaping Use   Vaping status: Never Used  Substance and Sexual Activity   Alcohol use: Yes    Alcohol/week: 0.0 - 2.0 standard drinks of alcohol   Drug use: Never   Sexual activity: Not on file  Other Topics Concern   Not on file  Social History Narrative  Not on file   Social Determinants of Health   Financial Resource Strain: Not on file  Food Insecurity: Not on file  Transportation Needs: Not on file  Physical Activity: Not on file  Stress: Not on file  Social Connections: Not on file    MEDICATIONS:  Current Outpatient Medications  Medication Sig Dispense Refill   albuterol (PROVENTIL HFA;VENTOLIN HFA) 108 (90 BASE) MCG/ACT inhaler Inhale 2 puffs into the lungs every 6 (six) hours as needed for wheezing.     amLODipine-benazepril (LOTREL) 10-20 MG per capsule Take 1 capsule by mouth daily.     aspirin 81 MG tablet Take 81 mg by mouth daily.     atorvastatin (LIPITOR) 20 MG tablet Take 1 tablet by mouth daily.     BITTER MELON PO Take by mouth.     empagliflozin (JARDIANCE) 25 MG TABS tablet Take 1 tablet (25 mg total) by mouth  daily before breakfast. 90 tablet 1   glucose blood (ONETOUCH VERIO) test strip Use to check blood sugar once a day 100 each 6   hydrochlorothiazide (HYDRODIURIL) 12.5 MG tablet Take 12.5 mg by mouth daily.     metFORMIN (GLUCOPHAGE-XR) 500 MG 24 hr tablet Take 4 tablets (2,000 mg total) by mouth daily. (Patient taking differently: Take 1,500 mg by mouth daily.) 360 tablet 3   milk thistle 175 MG tablet Take 175 mg by mouth daily.     Multiple Vitamin (MULTIVITAMIN) tablet Take 1 tablet by mouth daily.     OneTouch Delica Lancets 33G MISC Use to check blood sugar once a day 100 each 3   dapagliflozin propanediol (FARXIGA) 10 MG TABS tablet Take 1 tablet (10 mg total) by mouth daily before breakfast. (Patient not taking: Reported on 08/24/2022) 90 tablet 1   No current facility-administered medications for this visit.    PHYSICAL EXAM: Vitals:   04/23/23 1106  BP: (!) 142/80  Pulse: 81  Resp: 20  SpO2: 97%  Weight: 177 lb (80.3 kg)  Height: 5\' 6"  (1.676 m)   Body mass index is 28.57 kg/m.  Wt Readings from Last 3 Encounters:  04/23/23 177 lb (80.3 kg)  10/16/22 177 lb 12.8 oz (80.6 kg)  08/24/22 171 lb (77.6 kg)    General: Well developed, well nourished male in no apparent distress.  HEENT: AT/[place], no external lesions.  Eyes: Conjunctiva clear and no icterus. Neck: Neck supple  Lungs: Respirations not labored Neurologic: Alert, oriented, normal speech Extremities / Skin: Dry. No sores or rashes noted.  Psychiatric: Does not appear depressed or anxious  Diabetic Foot Exam - Simple   No data filed     LABS Reviewed Lab Results  Component Value Date   HGBA1C 7.1 (H) 04/16/2023   HGBA1C 6.6 (H) 10/12/2022   HGBA1C 7.4 (H) 06/30/2022   No results found for: "FRUCTOSAMINE" Lab Results  Component Value Date   CHOL 176 03/14/2022   HDL 36.90 (L) 03/14/2022   LDLCALC 111 (H) 03/14/2022   TRIG 141.0 03/14/2022   CHOLHDL 5 03/14/2022   Lab Results  Component Value Date    MICRALBCREAT 1.2 04/16/2023   MICRALBCREAT 1.2 03/14/2022   Lab Results  Component Value Date   CREATININE 1.12 04/16/2023   Lab Results  Component Value Date   GFR 72.64 04/16/2023    ASSESSMENT / PLAN  1. Type 2 diabetes mellitus without complication, without long-term current use of insulin (HCC)   2. Hypokalemia     Diabetes Mellitus type 2, complicated by  no known complications. - Diabetic status / severity: Controlled.  Lab Results  Component Value Date   HGBA1C 7.1 (H) 04/16/2023    - Hemoglobin A1c goal : <7%  - Medications: See below  I) increase metformin extended release 500 mg from 3 tablets to 4 tablets daily.  Better to take with food. II) continue Jardiance 25 mg daily.  - Home glucose testing: In the morning fasting daily and at bedtime occasionally. - Discussed/ Gave Hypoglycemia treatment plan.  # Consult : not required at this time.   # Annual urine for microalbuminuria/ creatinine ratio, no microalbuminuria currently.  Continue benazepril. Last  Lab Results  Component Value Date   MICRALBCREAT 1.2 04/16/2023    # Foot check nightly.  # Annual dilated diabetic eye exams.  He is due for a diabetic eye exam.  Advised to have diabetic eye exam.  - Diet: Make healthy diabetic food choices - Life style / activity / exercise: Discussed.  2. Blood pressure  -  BP Readings from Last 1 Encounters:  04/23/23 (!) 142/80    - Control is in target.  - No change in current plans.  3. Lipid status / Hyperlipidemia - Last  Lab Results  Component Value Date   LDLCALC 111 (H) 03/14/2022   - Continue atorvastatin 20 mg daily.  Managed by primary care provider.  # Hypokalemia -Patient potassium slightly low 3.4, will recheck today.  Diagnoses and all orders for this visit:  Type 2 diabetes mellitus without complication, without long-term current use of insulin (HCC) -     Basic metabolic panel; Future -     Hemoglobin A1c;  Future  Hypokalemia -     Potassium; Future -     Potassium    DISPOSITION Follow up in clinic in 6 months suggested.   All questions answered and patient verbalized understanding of the plan.  [place] Inaara Tye, MD [facility] Endocrinology [facility] [address] [place], [address] [place], [place] [address] Phone # [phone]  At least part of this note was generated using voice recognition software. Inadvertent word errors may have occurred, which were not recognized during the proofreading process.

## 2023-06-12 ENCOUNTER — Encounter: Payer: Self-pay | Admitting: Endocrinology

## 2023-06-12 ENCOUNTER — Other Ambulatory Visit: Payer: Self-pay

## 2023-06-12 DIAGNOSIS — E1165 Type 2 diabetes mellitus with hyperglycemia: Secondary | ICD-10-CM

## 2023-06-12 MED ORDER — EMPAGLIFLOZIN 25 MG PO TABS: 25.0000 mg | ORAL_TABLET | Freq: Every day | ORAL | 1 refills | Status: DC

## 2023-09-13 ENCOUNTER — Other Ambulatory Visit: Payer: Self-pay

## 2023-09-13 DIAGNOSIS — E1165 Type 2 diabetes mellitus with hyperglycemia: Secondary | ICD-10-CM

## 2023-09-13 MED ORDER — EMPAGLIFLOZIN 25 MG PO TABS: 25.0000 mg | ORAL_TABLET | Freq: Every day | ORAL | 1 refills | Status: DC

## 2023-10-02 ENCOUNTER — Other Ambulatory Visit: Payer: Self-pay

## 2023-10-02 DIAGNOSIS — E119 Type 2 diabetes mellitus without complications: Secondary | ICD-10-CM

## 2023-10-22 ENCOUNTER — Other Ambulatory Visit: Payer: BC Managed Care – PPO

## 2023-10-23 ENCOUNTER — Encounter: Payer: Self-pay | Admitting: Endocrinology

## 2023-10-23 LAB — BASIC METABOLIC PANEL WITH GFR
BUN: 17 mg/dL (ref 7–25)
CO2: 29 mmol/L (ref 20–32)
Calcium: 9.4 mg/dL (ref 8.6–10.3)
Chloride: 102 mmol/L (ref 98–110)
Creat: 0.92 mg/dL (ref 0.70–1.30)
Glucose, Bld: 119 mg/dL — ABNORMAL HIGH (ref 65–99)
Potassium: 4 mmol/L (ref 3.5–5.3)
Sodium: 140 mmol/L (ref 135–146)
eGFR: 96 mL/min/{1.73_m2} (ref >=60)

## 2023-10-23 LAB — HEMOGLOBIN A1C
Hgb A1c MFr Bld: 7.3 % — ABNORMAL HIGH (ref <5.7)
Mean Plasma Glucose: 163 mg/dL
eAG (mmol/L): 9 mmol/L

## 2023-10-25 ENCOUNTER — Ambulatory Visit (INDEPENDENT_AMBULATORY_CARE_PROVIDER_SITE_OTHER): Payer: BC Managed Care – PPO | Admitting: Endocrinology

## 2023-10-25 ENCOUNTER — Encounter: Payer: Self-pay | Admitting: Endocrinology

## 2023-10-25 DIAGNOSIS — E1165 Type 2 diabetes mellitus with hyperglycemia: Secondary | ICD-10-CM

## 2023-10-25 DIAGNOSIS — Z7984 Long term (current) use of oral hypoglycemic drugs: Secondary | ICD-10-CM

## 2023-10-25 DIAGNOSIS — E785 Hyperlipidemia, unspecified: Secondary | ICD-10-CM | POA: Diagnosis not present

## 2023-10-25 DIAGNOSIS — E119 Type 2 diabetes mellitus without complications: Secondary | ICD-10-CM

## 2023-10-25 NOTE — Progress Notes (Signed)
 Patient was previously seen by Dr. Hubert Madden and was last time seen in April 2024.  Patient was previously seen by Dr. Hubert Madden and was last time seen in April 2024.  Outpatient Endocrinology Note Jon Evann Koelzer, MD  10/25/23  Patient's Name: Jon Vega    DOB: 10/03/1965    MRN: [id number]                                                    REASON OF VISIT: Follow up of type 2 diabetes mellitus  PCP: Jon Certain Dibas, MD  HISTORY OF PRESENT ILLNESS:   Jon Vega is a 58 y.o. old male with past medical history listed below, is here for follow up for type 2 diabetes mellitus.   Pertinent Diabetes History: Patient was previously seen by Dr. Hubert Madden and was last time seen in April 2024.  Patient was diagnosed with type 2 diabetes mellitus in 2017.  Chronic Diabetes Complications : Retinopathy: no. Last ophthalmology exam was done on due.  Nephropathy: no, on benazepril. Peripheral neuropathy: no Coronary artery disease: no Stroke: no  Relevant comorbidities and cardiovascular risk factors: Obesity: no There is no height or weight on file to calculate BMI.  Hypertension: Yes  Hyperlipidemia : Yes, on statin   Current / Home Diabetic regimen includes: Jardiance  25 mg daily. Metformin  XR 2000 mg daily.  Prior diabetic medications: Farxiga  changed to Jardiance  due to insurance preference.  Glycemic data:   One Touch Verio Flex glucometer.  Patient forgot to bring glucometer in the clinic today.  He reports his morning blood sugar usually 120 range and evening around 130 range.    Hypoglycemia: Patient has no hypoglycemic episodes. Patient has hypoglycemia awareness.  Factors modifying glucose control: 1.  Diabetic diet assessment: 2-3 meals a day.   2.  Staying active or exercising: [employer] 2-3 times a week.   3.  Medication compliance: compliant all of the time.  Interval history  Hemoglobin A1c 7.3%.  Recent lab results of BMP reviewed acceptable.  Diabetes has been as reviewed and  noted above.  He has been now taking metformin  2000 mg daily.  He occasionally complains of numbness of the right feet starting from thigh area into the calf and onto the feet, discussed that it can be related with a back issue, follow-up with primary care provider.  No vision problem.  No other complaints today.  REVIEW OF SYSTEMS As per history of present illness.   PAST MEDICAL HISTORY: Past Medical History:  Diagnosis Date   Asthma    Asthma 10/22/2012   Diabetes mellitus without complication (HCC)    Dyslipidemia 10/22/2012   Hypertension     PAST SURGICAL HISTORY: History reviewed. No pertinent surgical history.  ALLERGIES: No Known Allergies  FAMILY HISTORY:  Family History  Problem Relation Age of Onset   Clotting disorder Mother    Diabetes Mother    Hypertension Mother    Hypertension Father    Stroke Brother     SOCIAL HISTORY: Social History   Socioeconomic History   Marital status: Married    Spouse name: Not on file   Number of children: Not on file   Years of education: Not on file   Highest education level: Not on file  Occupational History   Not on file  Tobacco Use   Smoking status: Never  Smokeless tobacco: Never  Vaping Use   Vaping status: Never Used  Substance and Sexual Activity   Alcohol use: Yes    Alcohol/week: 0.0 - 2.0 standard drinks of alcohol   Drug use: Never   Sexual activity: Not on file  Other Topics Concern   Not on file  Social History Narrative   Not on file   Social Drivers of Health   Financial Resource Strain: Not on file  Food Insecurity: Not on file  Transportation Needs: Not on file  Physical Activity: Not on file  Stress: Not on file  Social Connections: Not on file    MEDICATIONS:  Current Outpatient Medications  Medication Sig Dispense Refill   albuterol (PROVENTIL HFA;VENTOLIN HFA) 108 (90 BASE) MCG/ACT inhaler Inhale 2 puffs into the lungs every 6 (six) hours as needed for wheezing.      amLODipine-benazepril (LOTREL) 10-20 MG per capsule Take 1 capsule by mouth daily.     aspirin 81 MG tablet Take 81 mg by mouth daily.     atorvastatin (LIPITOR) 20 MG tablet Take 1 tablet by mouth daily.     BITTER MELON PO Take by mouth.     dapagliflozin  propanediol (FARXIGA ) 10 MG TABS tablet Take 1 tablet (10 mg total) by mouth daily before breakfast. (Patient not taking: Reported on 08/24/2022) 90 tablet 1   empagliflozin  (JARDIANCE ) 25 MG TABS tablet Take 1 tablet (25 mg total) by mouth daily before breakfast. 90 tablet 1   glucose blood (ONETOUCH VERIO) test strip Use to check blood sugar once a day 100 each 6   hydrochlorothiazide (HYDRODIURIL) 12.5 MG tablet Take 12.5 mg by mouth daily.     metFORMIN  (GLUCOPHAGE -XR) 500 MG 24 hr tablet Take 4 tablets (2,000 mg total) by mouth daily. (Patient taking differently: Take 1,500 mg by mouth daily.) 360 tablet 3   milk thistle 175 MG tablet Take 175 mg by mouth daily.     Multiple Vitamin (MULTIVITAMIN) tablet Take 1 tablet by mouth daily.     OneTouch Delica Lancets 33G MISC Use to check blood sugar once a day 100 each 3   No current facility-administered medications for this visit.    PHYSICAL EXAM: There were no vitals filed for this visit.  There is no height or weight on file to calculate BMI.  Wt Readings from Last 3 Encounters:  04/23/23 177 lb (80.3 kg)  10/16/22 177 lb 12.8 oz (80.6 kg)  08/24/22 171 lb (77.6 kg)    General: Well developed, well nourished male in no apparent distress.  HEENT: AT/[place], no external lesions.  Eyes: Conjunctiva clear and no icterus. Neck: Neck supple  Lungs: Respirations not labored Neurologic: Alert, oriented, normal speech Extremities / Skin: Dry.  Psychiatric: Does not appear depressed or anxious  Diabetic Foot Exam - Simple   Simple Foot Form Diabetic Foot exam was performed with the following findings: Yes 10/25/2023 11:12 AM  Visual Inspection No deformities, no ulcerations, no other  skin breakdown bilaterally: Yes Sensation Testing Intact to touch and monofilament testing bilaterally: Yes Pulse Check Posterior Tibialis and Dorsalis pulse intact bilaterally: Yes Comments     LABS Reviewed Lab Results  Component Value Date   HGBA1C 7.3 (H) 10/22/2023   HGBA1C 7.1 (H) 04/16/2023   HGBA1C 6.6 (H) 10/12/2022   No results found for: "FRUCTOSAMINE" Lab Results  Component Value Date   CHOL 176 03/14/2022   HDL 36.90 (L) 03/14/2022   LDLCALC 111 (H) 03/14/2022   TRIG 141.0 03/14/2022  CHOLHDL 5 03/14/2022   Lab Results  Component Value Date   MICRALBCREAT 1.2 04/16/2023   MICRALBCREAT 1.2 03/14/2022   Lab Results  Component Value Date   CREATININE 0.92 10/22/2023   Lab Results  Component Value Date   GFR 72.64 04/16/2023    ASSESSMENT / PLAN  1. Type 2 diabetes mellitus without complication, without long-term current use of insulin (HCC)   2. Dyslipidemia   3. Uncontrolled type 2 diabetes mellitus with hyperglycemia (HCC)      Diabetes Mellitus type 2, complicated by no known complications. - Diabetic status / severity: uncontrolled.  Lab Results  Component Value Date   HGBA1C 7.3 (H) 10/22/2023    - Hemoglobin A1c goal : <6.5%  Patient prefers not to add medication.  Discussed in detail about portion control and limiting carbohydrates and increase exercise.  If his diabetes control remains high will consider third antidiabetic medication in the follow-up visit.  - Medications: See below  I) continue metformin  extended release 2000 mg daily.  Better to take with food. II) continue Jardiance  25 mg daily.  - Home glucose testing: In the morning fasting daily and at bedtime occasionally. - Discussed/ Gave Hypoglycemia treatment plan.  # Consult : not required at this time.   # Annual urine for microalbuminuria/ creatinine ratio, no microalbuminuria currently.  Continue benazepril. Last  Lab Results  Component Value Date    MICRALBCREAT 1.2 04/16/2023    # Foot check nightly.  # Annual dilated diabetic eye exams.  He is due for a diabetic eye exam.  Advised to have diabetic eye exam.  - Diet: Make healthy diabetic food choices - Life style / activity / exercise: Discussed.  2. Blood pressure  -  BP Readings from Last 1 Encounters:  04/23/23 (!) 142/80    - Control is in target.  - No change in current plans.  3. Lipid status / Hyperlipidemia - Last  Lab Results  Component Value Date   LDLCALC 111 (H) 03/14/2022   - Continue atorvastatin 20 mg daily.  Managed by primary care provider.   Diagnoses and all orders for this visit:  Type 2 diabetes mellitus without complication, without long-term current use of insulin (HCC) -     Basic Metabolic Panel Without GFR -     Hemoglobin A1c -     Microalbumin / creatinine urine ratio  Dyslipidemia -     Lipid panel  Uncontrolled type 2 diabetes mellitus with hyperglycemia (HCC)     DISPOSITION Follow up in clinic in 6 months suggested.  Labs prior to follow-up visit as ordered.   All questions answered and patient verbalized understanding of the plan.  Jon Vedanth Sirico, MD [facility] Endocrinology [facility] [address] [place], [address] [place], [place] [address] Phone # [phone]  At least part of this note was generated using voice recognition software. Inadvertent word errors may have occurred, which were not recognized during the proofreading process.

## 2023-12-26 ENCOUNTER — Encounter: Payer: Self-pay | Admitting: Endocrinology

## 2023-12-26 ENCOUNTER — Other Ambulatory Visit: Payer: Self-pay

## 2023-12-26 DIAGNOSIS — E1165 Type 2 diabetes mellitus with hyperglycemia: Secondary | ICD-10-CM

## 2023-12-26 MED ORDER — EMPAGLIFLOZIN 25 MG PO TABS
25.0000 mg | ORAL_TABLET | Freq: Every day | ORAL | 1 refills | Status: DC
Start: 2023-12-26 — End: 2024-04-28

## 2024-02-18 ENCOUNTER — Other Ambulatory Visit: Payer: Self-pay | Admitting: Medical Oncology

## 2024-02-18 DIAGNOSIS — D45 Polycythemia vera: Secondary | ICD-10-CM

## 2024-02-19 ENCOUNTER — Inpatient Hospital Stay: Attending: Internal Medicine | Admitting: Internal Medicine

## 2024-02-19 ENCOUNTER — Inpatient Hospital Stay

## 2024-02-19 VITALS — BP 142/89 | HR 78 | Temp 97.3°F | Resp 17 | Ht 66.0 in | Wt 166.0 lb

## 2024-02-19 DIAGNOSIS — D751 Secondary polycythemia: Secondary | ICD-10-CM | POA: Diagnosis present

## 2024-02-19 DIAGNOSIS — D45 Polycythemia vera: Secondary | ICD-10-CM

## 2024-02-19 LAB — CBC WITH DIFFERENTIAL (CANCER CENTER ONLY)
Abs Immature Granulocytes: 0.02 K/uL (ref 0.00–0.07)
Basophils Absolute: 0.1 K/uL (ref 0.0–0.1)
Basophils Relative: 1 %
Eosinophils Absolute: 0.2 K/uL (ref 0.0–0.5)
Eosinophils Relative: 4 %
HCT: 50.5 % (ref 39.0–52.0)
Hemoglobin: 16.6 g/dL (ref 13.0–17.0)
Immature Granulocytes: 1 %
Lymphocytes Relative: 35 %
Lymphs Abs: 1.5 K/uL (ref 0.7–4.0)
MCH: 26.5 pg (ref 26.0–34.0)
MCHC: 32.9 g/dL (ref 30.0–36.0)
MCV: 80.5 fL (ref 80.0–100.0)
Monocytes Absolute: 0.4 K/uL (ref 0.1–1.0)
Monocytes Relative: 8 %
Neutro Abs: 2.2 K/uL (ref 1.7–7.7)
Neutrophils Relative %: 51 %
Platelet Count: 234 K/uL (ref 150–400)
RBC: 6.27 MIL/uL — ABNORMAL HIGH (ref 4.22–5.81)
RDW: 13.2 % (ref 11.5–15.5)
WBC Count: 4.3 K/uL (ref 4.0–10.5)
nRBC: 0 % (ref 0.0–0.2)

## 2024-02-19 LAB — CMP (CANCER CENTER ONLY)
ALT: 24 U/L (ref 0–44)
AST: 20 U/L (ref 15–41)
Albumin: 4.3 g/dL (ref 3.5–5.0)
Alkaline Phosphatase: 69 U/L (ref 38–126)
Anion gap: 6 (ref 5–15)
BUN: 16 mg/dL (ref 6–20)
CO2: 26 mmol/L (ref 22–32)
Calcium: 9.1 mg/dL (ref 8.9–10.3)
Chloride: 107 mmol/L (ref 98–111)
Creatinine: 0.86 mg/dL (ref 0.61–1.24)
GFR, Estimated: >60 mL/min (ref >60)
Glucose, Bld: 184 mg/dL — ABNORMAL HIGH (ref 70–99)
Potassium: 3.8 mmol/L (ref 3.5–5.1)
Sodium: 139 mmol/L (ref 135–145)
Total Bilirubin: 1.1 mg/dL (ref 0.0–1.2)
Total Protein: 7.1 g/dL (ref 6.5–8.1)

## 2024-02-19 NOTE — Progress Notes (Signed)
 [facility] [phone]   [phone]  CONSULT NOTE  REFERRING PHYSICIAN: Dr. Elfrieda Koirala  REASON FOR CONSULTATION:  Evaluation of polycythemia  HPI Jon Vega is a 58 y.o. male.   HPI  Discussed the use of AI scribe software for clinical note transcription with the patient, who gave verbal consent to proceed.  History of Present Illness Jon Vega is a 58 year old male who presents with polycythemia. He was referred by Dr. Regino for evaluation of elevated red blood cells.  He has a history of elevated red blood cells first noted eleven years ago, which was not previously addressed. Recent blood work on July 3rd showed elevated hemoglobin at 17.7 and hematocrit at 55.8, with normal white blood cell and platelet counts.  He experiences occasional chills at night, which he attributes to dehydration from hydrochlorothiazide use. He stopped the medication recently, and the chills have resolved. He denies taking any hormones, testosterone, or natural supplements for sexual drive. No recent bleeding, bruising, chest pain, shortness of breath, cough, nausea, vomiting, diarrhea, or headaches. He notes a weight loss from 170 to 166 pounds, which he is unsure if it is related to medication changes.  His medical history includes asthma, diabetes, hypertension, and hypercholesterolemia, for which he is on medication. Family history is significant for his mother having von Willebrand disease, diabetes, and hypertension. His father had hypertension and a heart condition. His brother had a stroke, and several family members on his father's side died of aortic aneurysm.  He is married with two daughters, aged 32 and 66. He works as a [employer]. He denies smoking, regular alcohol use, and drug use. He is not allergic to any medications.     Past Medical History:  Diagnosis Date   Asthma    Asthma 10/22/2012   Diabetes mellitus without  complication (HCC)    Dyslipidemia 10/22/2012   Hypertension       No past surgical history on file.  Family History  Problem Relation Age of Onset   Clotting disorder Mother    Diabetes Mother    Hypertension Mother    Hypertension Father    Stroke Brother     Social History Social History   Tobacco Use   Smoking status: Never   Smokeless tobacco: Never  Vaping Use   Vaping status: Never Used  Substance Use Topics   Alcohol use: Yes    Alcohol/week: 0.0 - 2.0 standard drinks of alcohol   Drug use: Never    No Known Allergies  Current Outpatient Medications  Medication Sig Dispense Refill   albuterol (PROVENTIL HFA;VENTOLIN HFA) 108 (90 BASE) MCG/ACT inhaler Inhale 2 puffs into the lungs every 6 (six) hours as needed for wheezing.     amLODipine-benazepril (LOTREL) 10-20 MG per capsule Take 1 capsule by mouth daily.     aspirin 81 MG tablet Take 81 mg by mouth daily.     atorvastatin (LIPITOR) 20 MG tablet Take 1 tablet by mouth daily.     BITTER MELON PO Take by mouth.     empagliflozin  (JARDIANCE ) 25 MG TABS tablet Take 1 tablet (25 mg total) by mouth daily before breakfast. 90 tablet 1   glucose blood (ONETOUCH VERIO) test strip Use to check blood sugar once a day 100 each 6   hydrochlorothiazide (HYDRODIURIL) 12.5 MG tablet Take 12.5 mg by mouth daily.     metFORMIN  (GLUCOPHAGE -XR) 500 MG 24 hr tablet Take 4 tablets (2,000  mg total) by mouth daily. (Patient taking differently: Take 1,500 mg by mouth daily.) 360 tablet 3   Multiple Vitamin (MULTIVITAMIN) tablet Take 1 tablet by mouth daily.     OneTouch Delica Lancets 33G MISC Use to check blood sugar once a day 100 each 3   dapagliflozin  propanediol (FARXIGA ) 10 MG TABS tablet Take 1 tablet (10 mg total) by mouth daily before breakfast. (Patient not taking: Reported on 08/24/2022) 90 tablet 1   milk thistle 175 MG tablet Take 175 mg by mouth daily.     No current facility-administered medications for this visit.     Review of Systems  Constitutional: negative Eyes: negative Ears, nose, mouth, throat, and face: negative Respiratory: negative Cardiovascular: negative Gastrointestinal: negative Genitourinary:negative Integument/breast: negative Hematologic/lymphatic: negative Musculoskeletal:negative Neurological: negative Behavioral/Psych: negative Endocrine: negative Allergic/Immunologic: negative  Physical Exam  MJO:jozmu, healthy, no distress, well nourished, well developed, and anxious SKIN: skin color, texture, turgor are normal, no rashes or significant lesions HEAD: Normocephalic, No masses, lesions, tenderness or abnormalities EYES: normal, PERRLA, Conjunctiva are pink and non-injected EARS: External ears normal, Canals clear OROPHARYNX:no exudate, no erythema, and lips, buccal mucosa, and tongue normal  NECK: supple, no adenopathy, no JVD LYMPH:  no palpable lymphadenopathy, no hepatosplenomegaly LUNGS: clear to auscultation , and palpation HEART: regular rate & rhythm and no murmurs ABDOMEN:abdomen soft, non-tender, normal bowel sounds, and no masses or organomegaly BACK: Back symmetric, no curvature., No CVA tenderness EXTREMITIES:no joint deformities, effusion, or inflammation, no edema  NEURO: alert & oriented x 3 with fluent speech, no focal motor/sensory deficits  PERFORMANCE STATUS: ECOG 0  LABORATORY DATA: Lab Results  Component Value Date   WBC 4.3 02/19/2024   HGB 16.6 02/19/2024   HCT 50.5 02/19/2024   MCV 80.5 02/19/2024   PLT 234 02/19/2024      Chemistry      Component Value Date/Time   NA 139 02/19/2024 1134   K 3.8 02/19/2024 1134   CL 107 02/19/2024 1134   CO2 26 02/19/2024 1134   BUN 16 02/19/2024 1134   CREATININE 0.86 02/19/2024 1134   CREATININE 0.92 10/22/2023 1144      Component Value Date/Time   CALCIUM 9.1 02/19/2024 1134   ALKPHOS 69 02/19/2024 1134   AST 20 02/19/2024 1134   ALT 24 02/19/2024 1134   BILITOT 1.1 02/19/2024 1134        RADIOGRAPHIC STUDIES: No results found.  ASSESSMENT: This is a very pleasant 58 years old white male presented for evaluation of polycythemia likely reactive in nature secondary to sleep apnea.  Polycythemia vera could not be completely excluded at this point.   PLAN: Assessment and Plan Assessment & Plan Elevated hemoglobin and red blood cell count (evaluation for secondary polycythemia) Hemoglobin was elevated at 17.7 g/dL on July 3rd, with a hematocrit of 55.8%. Current hemoglobin is 16.6 g/dL, within normal range. Elevated red blood cells but not consistently elevated hemoglobin or hematocrit. Differential diagnosis includes polycythemia vera and secondary causes such as obstructive sleep apnea, asthma, and dietary factors. No current evidence of polycythemia vera. Discussed potential causes including dietary iron intake and sleep apnea. No immediate need for JAK2 mutation testing due to current normal hemoglobin levels and cost considerations. Discussed risks of high hemoglobin, including increased risk of stroke, and the role of aspirin in reducing this risk.  - Repeat blood count in 2-3 months. - Advise reducing red meat intake to twice a week. - Advise reducing iron-containing supplements. - Consider JAK2 mutation testing  if hemoglobin remains elevated in future tests.  Obstructive sleep apnea (nonadherence to oral appliance) Obstructive sleep apnea with nonadherence to oral appliance therapy. Sleep apnea is a potential secondary cause of elevated hemoglobin and red blood cell count. Discussed the importance of adherence to treatment to manage symptoms and potentially reduce hemoglobin levels. The patient was advised to call immediately if he has any concerning symptoms in the interval.  The patient voices understanding of current disease status and treatment options and is in agreement with the current care plan.  All questions were answered. The patient knows to call the  clinic with any problems, questions or concerns. We can certainly see the patient much sooner if necessary.  Thank you so much for allowing me to participate in the care of Bradan Keithly. I will continue to follow up the patient with you and assist in his care. The total time spent in the appointment was 60 minutes including review of chart and various tests results, discussions about plan of care and coordination of care plan .   Disclaimer: This note was dictated with voice recognition software. Similar sounding words can inadvertently be transcribed and may not be corrected upon review.   Sherrod MARLA Sherrod February 19, 2024, 12:25 PM

## 2024-03-10 ENCOUNTER — Ambulatory Visit: Admitting: Sports Medicine

## 2024-03-10 ENCOUNTER — Encounter: Payer: Self-pay | Admitting: Sports Medicine

## 2024-03-10 VITALS — BP 138/88 | Ht 66.0 in | Wt 166.0 lb

## 2024-03-10 DIAGNOSIS — M5416 Radiculopathy, lumbar region: Secondary | ICD-10-CM | POA: Diagnosis not present

## 2024-03-10 NOTE — Progress Notes (Signed)
   Subjective:    Patient ID: Jon Vega, male    DOB: November 30, 1965, 58 y.o.   MRN: [id number]  HPI chief complaint: Low back pain  Pink is a very pleasant 58 year old male that presents today with approximately 3 months of low back pain.  He does not recall any specific injury.  Pain will radiate down the right leg into the right foot.  It is worse with prolonged sitting.  He has been undergoing chiropractic treatment with minimal improvement.  He does find relief with over-the-counter Tylenol.  He does recall a similar problem with his low back approximately 10 years ago.  Past medical history reviewed Medications reviewed Allergies reviewed  Review of Systems As above    Objective:   Physical Exam  Well-developed, well-nourished.  No acute distress  Lumbar spine: Good lumbar range of motion.  No tenderness to palpation.  Equivocal straight leg raise on the right.  Neurological exam shows reflexes to be equal at the patellar and Achilles tendons bilaterally.  Strength is 5/5 in both lower extremities.  X-rays from his chiropractic office are reviewed.  There is some mild disc space narrowing at L4-L5.  Remainder of his lumbar spine is unremarkable.      Assessment & Plan:   Low back pain and right leg radiculopathy likely secondary to bulging lumbar disc  [place] extension home exercise program.  He will continue with over-the-counter Tylenol.  He has an upcoming [place] trip at the end of next week and I have recommended that he return to the office for follow-up visit when he returns to town.  If symptoms persist or worsen, then consideration for formal PT for MRI should be made.  This note was dictated using Dragon naturally speaking software and may contain errors in syntax, spelling, or content which have not been identified prior to signing this note.

## 2024-03-12 ENCOUNTER — Encounter: Payer: Self-pay | Admitting: *Deleted

## 2024-03-12 NOTE — Addendum Note (Signed)
 Addended by: MARCINE HARLENE SAILOR on: 03/12/2024 08:18 AM   Modules accepted: Orders

## 2024-03-19 ENCOUNTER — Other Ambulatory Visit

## 2024-04-21 ENCOUNTER — Ambulatory Visit

## 2024-04-24 ENCOUNTER — Other Ambulatory Visit

## 2024-04-25 ENCOUNTER — Ambulatory Visit: Payer: Self-pay | Admitting: Endocrinology

## 2024-04-25 LAB — BASIC METABOLIC PANEL WITHOUT GFR
BUN: 16 mg/dL (ref 7–25)
CO2: 26 mmol/L (ref 20–32)
Calcium: 9.7 mg/dL (ref 8.6–10.3)
Chloride: 104 mmol/L (ref 98–110)
Creat: 0.89 mg/dL (ref 0.70–1.30)
Glucose, Bld: 104 mg/dL — ABNORMAL HIGH (ref 65–99)
Potassium: 4 mmol/L (ref 3.5–5.3)
Sodium: 139 mmol/L (ref 135–146)

## 2024-04-25 LAB — HEMOGLOBIN A1C
Hgb A1c MFr Bld: 6.7 % — ABNORMAL HIGH
Mean Plasma Glucose: 146 mg/dL
eAG (mmol/L): 8.1 mmol/L

## 2024-04-25 LAB — MICROALBUMIN / CREATININE URINE RATIO
Creatinine, Urine: 110 mg/dL (ref 20–320)
Microalb Creat Ratio: 13 mg/g{creat} (ref <30)
Microalb, Ur: 1.4 mg/dL

## 2024-04-25 LAB — LIPID PANEL
Cholesterol: 159 mg/dL (ref <200)
HDL: 53 mg/dL (ref >=40)
LDL Cholesterol (Calc): 82 mg/dL
Non-HDL Cholesterol (Calc): 106 mg/dL (ref <130)
Total CHOL/HDL Ratio: 3 (calc) (ref <5.0)
Triglycerides: 141 mg/dL (ref <150)

## 2024-04-26 ENCOUNTER — Ambulatory Visit
Admission: RE | Admit: 2024-04-26 | Discharge: 2024-04-26 | Disposition: A | Source: Ambulatory Visit | Attending: Sports Medicine | Admitting: Sports Medicine

## 2024-04-26 DIAGNOSIS — M5416 Radiculopathy, lumbar region: Secondary | ICD-10-CM

## 2024-04-28 ENCOUNTER — Ambulatory Visit: Admitting: Endocrinology

## 2024-04-28 ENCOUNTER — Encounter: Payer: Self-pay | Admitting: Endocrinology

## 2024-04-28 ENCOUNTER — Encounter: Payer: Self-pay | Admitting: Sports Medicine

## 2024-04-28 VITALS — BP 138/84 | Ht 66.0 in | Wt 170.0 lb

## 2024-04-28 DIAGNOSIS — E1165 Type 2 diabetes mellitus with hyperglycemia: Secondary | ICD-10-CM | POA: Diagnosis not present

## 2024-04-28 DIAGNOSIS — G4733 Obstructive sleep apnea (adult) (pediatric): Secondary | ICD-10-CM | POA: Insufficient documentation

## 2024-04-28 DIAGNOSIS — Z7984 Long term (current) use of oral hypoglycemic drugs: Secondary | ICD-10-CM | POA: Diagnosis not present

## 2024-04-28 DIAGNOSIS — E7800 Pure hypercholesterolemia, unspecified: Secondary | ICD-10-CM | POA: Insufficient documentation

## 2024-04-28 DIAGNOSIS — J4520 Mild intermittent asthma, uncomplicated: Secondary | ICD-10-CM | POA: Insufficient documentation

## 2024-04-28 MED ORDER — EMPAGLIFLOZIN 25 MG PO TABS: 25.0000 mg | ORAL_TABLET | Freq: Every day | ORAL | 3 refills | Status: DC

## 2024-04-28 MED ORDER — METFORMIN HCL ER 500 MG PO TB24: 2000.0000 mg | ORAL_TABLET | Freq: Every day | ORAL | 3 refills | Status: AC

## 2024-04-28 NOTE — Progress Notes (Signed)
 Outpatient Endocrinology Note Nadyne Gariepy, MD  04/28/24  Patient's Name: Jon Vega    DOB: 1966-01-25    MRN: [id number]                                                    REASON OF VISIT: Follow up of type 2 diabetes mellitus  PCP: Regino Slater, MD  HISTORY OF PRESENT ILLNESS:   Jon Vega is a 58 y.o. old male with past medical history listed below, is here for follow up for type 2 diabetes mellitus.   Pertinent Diabetes History: Patient was previously seen by Dr. Von and was last time seen in April 2024.  Patient was diagnosed with type 2 diabetes mellitus in 2017.  Chronic Diabetes Complications : Retinopathy: no. Last ophthalmology exam was done on due.  Nephropathy: no, on benazepril. Peripheral neuropathy: no Coronary artery disease: no Stroke: no  Relevant comorbidities and cardiovascular risk factors: Obesity: no Body mass index is 27.44 kg/m.  Hypertension: Yes  Hyperlipidemia : Yes, on statin   Current / Home Diabetic regimen includes: Jardiance  25 mg daily. Metformin  XR 2000 mg daily.  Prior diabetic medications: Farxiga  changed to Jardiance  due to insurance preference.  Glycemic data:   One Touch Verio Flex glucometer.  Download from October 20 to April 28, 2024 average blood sugar 117.  He has been checking fasting mostly every day.  Some of the blood sugar 123, 120, 124, 107, 103.   Hypoglycemia: Patient has no hypoglycemic episodes. Patient has hypoglycemia awareness.  Factors modifying glucose control: 1.  Diabetic diet assessment: 2-3 meals a day.   2.  Staying active or exercising: [employer] 2-3 times a week.   3.  Medication compliance: compliant all of the time.  Interval history  Recent laboratory results reviewed, hemoglobin A1c improved to 6.7%.  Glucometer data as reviewed above, mostly acceptable blood sugar.  Diabetes regimen is reviewed and noted above.  He denies numbness and ting of the feet.  No vision problem.  No  other complaints today.  Recent laboratory results reviewed with normal electrolytes and a stable renal function and acceptable cholesterol level, normal urine microalbumin creatinine ratio.  REVIEW OF SYSTEMS As per history of present illness.   PAST MEDICAL HISTORY: Past Medical History:  Diagnosis Date   Asthma    Asthma 10/22/2012   Diabetes mellitus without complication (HCC)    Dyslipidemia 10/22/2012   Hypertension     PAST SURGICAL HISTORY: History reviewed. No pertinent surgical history.  ALLERGIES: Allergies  Allergen Reactions   Grass Pollen(K-O-R-T-Swt Vern) Shortness Of Breath   Benazepril Hcl Swelling    Other Reaction(s): uvualar swelling    FAMILY HISTORY:  Family History  Problem Relation Age of Onset   Clotting disorder Mother    Diabetes Mother    Hypertension Mother    Hypertension Father    Stroke Brother     SOCIAL HISTORY: Social History   Socioeconomic History   Marital status: Married    Spouse name: Not on file   Number of children: Not on file   Years of education: Not on file   Highest education level: Not on file  Occupational History   Not on file  Tobacco Use   Smoking status: Never   Smokeless tobacco: Never  Vaping Use   Vaping status: Never  Used  Substance and Sexual Activity   Alcohol use: Yes    Alcohol/week: 0.0 - 2.0 standard drinks of alcohol   Drug use: Never   Sexual activity: Not on file  Other Topics Concern   Not on file  Social History Narrative   Not on file   Social Drivers of Health   Financial Resource Strain: Not on file  Food Insecurity: No Food Insecurity (02/19/2024)   Hunger Vital Sign    Worried About Running Out of Food in the Last Year: Never true    Ran Out of Food in the Last Year: Never true  Transportation Needs: No Transportation Needs (02/19/2024)   PRAPARE - Administrator, Civil Service (Medical): No    Lack of Transportation (Non-Medical): No  Physical Activity: Not on  file  Stress: Not on file  Social Connections: Not on file    MEDICATIONS:  Current Outpatient Medications  Medication Sig Dispense Refill   albuterol (PROVENTIL HFA;VENTOLIN HFA) 108 (90 BASE) MCG/ACT inhaler Inhale 2 puffs into the lungs every 6 (six) hours as needed for wheezing.     amLODipine (NORVASC) 10 MG tablet Take 10 mg by mouth daily.     aspirin 81 MG tablet Take 81 mg by mouth daily.     atorvastatin (LIPITOR) 20 MG tablet Take 1 tablet by mouth daily.     BITTER MELON PO Take by mouth.     glucose blood (ONETOUCH VERIO) test strip Use to check blood sugar once a day 100 each 6   Multiple Vitamin (MULTIVITAMIN) tablet Take 1 tablet by mouth daily.     OneTouch Delica Lancets 33G MISC Use to check blood sugar once a day 100 each 3   empagliflozin  (JARDIANCE ) 25 MG TABS tablet Take 1 tablet (25 mg total) by mouth daily before breakfast. 90 tablet 3   metFORMIN  (GLUCOPHAGE -XR) 500 MG 24 hr tablet Take 4 tablets (2,000 mg total) by mouth daily. 360 tablet 3   No current facility-administered medications for this visit.    PHYSICAL EXAM: Vitals:   04/28/24 1100  BP: 138/84  Weight: 170 lb (77.1 kg)  Height: 5' 6 (1.676 m)    Body mass index is 27.44 kg/m.  Wt Readings from Last 3 Encounters:  04/28/24 170 lb (77.1 kg)  03/10/24 166 lb (75.3 kg)  02/19/24 166 lb (75.3 kg)    General: Well developed, well nourished male in no apparent distress.  HEENT: AT/[place], no external lesions.  Eyes: Conjunctiva clear and no icterus. Neck: Neck supple  Lungs: Respirations not labored Neurologic: Alert, oriented, normal speech Extremities / Skin: Dry.  Psychiatric: Does not appear depressed or anxious  Diabetic Foot Exam - Simple   No data filed     LABS Reviewed Lab Results  Component Value Date   HGBA1C 6.7 (H) 04/24/2024   HGBA1C 7.3 (H) 10/22/2023   HGBA1C 7.1 (H) 04/16/2023   No results found for: FRUCTOSAMINE Lab Results  Component Value Date   CHOL 159  04/24/2024   HDL 53 04/24/2024   LDLCALC 82 04/24/2024   TRIG 141 04/24/2024   CHOLHDL 3.0 04/24/2024   Lab Results  Component Value Date   MICRALBCREAT 13 04/24/2024   Lab Results  Component Value Date   CREATININE 0.89 04/24/2024   Lab Results  Component Value Date   GFR 72.64 04/16/2023    ASSESSMENT / PLAN  1. Uncontrolled type 2 diabetes mellitus with hyperglycemia, without long-term current use of insulin (HCC)  Diabetes Mellitus type 2, complicated by no known complications. - Diabetic status / severity: uncontrolled.  Improving.  Lab Results  Component Value Date   HGBA1C 6.7 (H) 04/24/2024    - Hemoglobin A1c goal : <6.5%  Diabetes control is improving.  Advised patient to work on diet with limiting carbohydrate and portion control.  Will not add medication.  - Medications: See below  I) continue metformin  extended release 2000 mg daily.  Better to take with food. II) continue Jardiance  25 mg daily.  - Home glucose testing: In the morning fasting daily and at bedtime occasionally.  - Discussed/ Gave Hypoglycemia treatment plan.  # Consult : not required at this time.   # Annual urine for microalbuminuria/ creatinine ratio, no microalbuminuria currently.  Continue ? benazepril.  On Jardiance . Last  Lab Results  Component Value Date   MICRALBCREAT 13 04/24/2024    # Foot check nightly.  # Annual dilated diabetic eye exams.  He is due for a diabetic eye exam.  Advised to have diabetic eye exam.  - Diet: Make healthy diabetic food choices - Life style / activity / exercise: Discussed.  2. Blood pressure  -  BP Readings from Last 1 Encounters:  04/28/24 138/84    - Control is in target.  - No change in current plans.  3. Lipid status / Hyperlipidemia - Last  Lab Results  Component Value Date   LDLCALC 82 04/24/2024   - Continue atorvastatin 20 mg daily.  Managed by primary care provider.   Jon Vega was seen today for  diabetes.  Diagnoses and all orders for this visit:  Uncontrolled type 2 diabetes mellitus with hyperglycemia, without long-term current use of insulin (HCC) -     Basic metabolic panel with GFR -     Hemoglobin A1c -     empagliflozin  (JARDIANCE ) 25 MG TABS tablet; Take 1 tablet (25 mg total) by mouth daily before breakfast. -     metFORMIN  (GLUCOPHAGE -XR) 500 MG 24 hr tablet; Take 4 tablets (2,000 mg total) by mouth daily.    DISPOSITION Follow up in clinic in 6 months suggested.  Labs prior to follow-up visit as ordered.   All questions answered and patient verbalized understanding of the plan.  Murvin Gift, MD [facility] Endocrinology [facility] [address] [place], [address] [place], [place] [address] Phone # [phone]  At least part of this note was generated using voice recognition software. Inadvertent word errors may have occurred, which were not recognized during the proofreading process.

## 2024-05-02 ENCOUNTER — Ambulatory Visit (INDEPENDENT_AMBULATORY_CARE_PROVIDER_SITE_OTHER)

## 2024-05-02 VITALS — BP 122/84 | Ht 66.0 in | Wt 170.0 lb

## 2024-05-02 DIAGNOSIS — M5137 Other intervertebral disc degeneration, lumbosacral region with discogenic back pain only: Secondary | ICD-10-CM

## 2024-05-02 DIAGNOSIS — M47816 Spondylosis without myelopathy or radiculopathy, lumbar region: Secondary | ICD-10-CM

## 2024-05-02 NOTE — Progress Notes (Signed)
   Subjective:    Patient ID: Jon Vega, male    DOB: 58 y.o., 1965-08-15   MRN: [id number]  Chief Complaint: MRI review  Discussed the use of AI scribe software for clinical note transcription with the patient, who gave verbal consent to proceed.  History of Present Illness Jon Vega is a 58 year old male who presents with persistent low back pain following a recent flare-up. He was originally seen by Dr. Arvell for his low back pain management.  Low back pain - Persistent low back pain following a recent flare-up after lifting a treadmill - Initial pain intensity rated at 5-6/10, currently improved to 1-2/10 - Pain is mostly absent but caution is exercised with movements that may exacerbate symptoms - Similar episode occurred approximately 10 years ago, previously managed with physical therapy - Recent MRI demonstrated joint and disc changes - Discomfort occurs with prolonged sitting, relieved by standing and walking - No current radiating pain; during MRI, tingling radiated down right leg to foot - Takes 500 mg Tylenol as needed for pain - Changed sleeping arrangement to Japanese tatami, which he believes has contributed to improvement  04/26/2024 lumbar spine MRI: Impression: 1. Central to right subarticular disc protrusion with inferior migration at L3-4, impinging upon the descending right L4 nerve root. 2. Right subarticular disc protrusion at L4-5 with resultant moderate right lateral recess stenosis, with mild right L4 foraminal narrowing. 3. Mild to moderate bilateral facet hypertrophy at L3-4 through L5-S1.    Objective:   Vitals:   05/02/24 1124  BP: 122/84   Const: appears well, non-toxic, well groomed Psych: affect bright, interactive, smiling EENT: EOMI intact, conjunctiva appear normal Neck: no obvious masses, appears symmetric Resp: non-labored, appears symmetric Neuro: muscle bulk appears normal Skin: no obvious rashes noted  Lumbar spine  exam was deferred today.  Assessment & Plan:   Assessment & Plan Very pleasant 58 y/o male with chronic low back pain with right-sided lumbar radiculopathy is likely due to degenerative lumbar disc disease with disc bulges and facet arthropathy. MRI reveals multilevel mild facet hypertrophy, anterior listhesis, and disc bulges with noted right-sided L4 nerve root impingement by adjacent disc. Pain has significantly improved from 5-6/10 to 1-2/10, and there are no current radicular symptoms, though previous tingling down the right leg strongly suggests nerve involvement. Prognosis is favorable as symptoms have improved without intervention. He was provided with exercises targeting specific muscles to improve posture and strengthen back support muscles, advised to perform these consistently for three weeks, then reduce to two or three times per week. The potential for steroid injections was discussed for acute flares, noting they provide temporary relief.   Greater than 30 minutes was spent reviewing patient's imaging and discussing with him face-to-face the findings of his MRI as well as management options for him going forward based on these findings.

## 2024-06-01 NOTE — Progress Notes (Deleted)
 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 But cannot exclude PCV at this point  PRIOR THERAPY: None  CURRENT THERAPY: ***  INTERVAL HISTORY: Jon Vega 58 y.o. male returns to the clinic today for a follow up visit. The patient established care in the clinic with Dr. Sherrod on 02/19/24.   The patient was referred to the clinic of polycythemia which Dr. Sherrod believed may be related to sleep apnea. The patient is *** with his CPAP.   He also is taking ***aspirin?  ****He also has reduced his read meat intake to *** per weel  ***Make sure not taking iron containing supplements.  If his Hbg continued to be elvated, then Dr. Sherrod would recommend JAK2 testing.   The patient denies any changes in his health since last being seen. He takes hydrochlorothiazide for his hypertension. His energy is ***. He denies unusual headaches, flushing, lightheadedness, or dizziness. He denies strokes or MI. He denies vision changes. He denies itching after a hot shower. He denies steroid or testosterone use. He is here for evaluation and repeat blood work.   MEDICAL HISTORY: Past Medical History:  Diagnosis Date   Asthma    Asthma 10/22/2012   Diabetes mellitus without complication (HCC)    Dyslipidemia 10/22/2012   Hypertension     ALLERGIES:  is allergic to grass pollen(k-o-r-t-swt vern) and benazepril hcl.  MEDICATIONS:  Current Outpatient Medications  Medication Sig Dispense Refill   albuterol (PROVENTIL HFA;VENTOLIN HFA) 108 (90 BASE) MCG/ACT inhaler Inhale 2 puffs into the lungs every 6 (six) hours as needed for wheezing.     amLODipine (NORVASC) 10 MG tablet Take 10 mg by mouth daily.     aspirin 81 MG tablet Take 81 mg by mouth daily.     atorvastatin (LIPITOR) 20 MG tablet Take 1 tablet by mouth daily.     BITTER MELON  PO Take by mouth.     empagliflozin  (JARDIANCE ) 25 MG TABS tablet Take 1 tablet (25 mg total) by mouth daily before breakfast. 90 tablet 3   glucose blood (ONETOUCH VERIO) test strip Use to check blood sugar once a day 100 each 6   metFORMIN  (GLUCOPHAGE -XR) 500 MG 24 hr tablet Take 4 tablets (2,000 mg total) by mouth daily. 360 tablet 3   Multiple Vitamin (MULTIVITAMIN) tablet Take 1 tablet by mouth daily.     OneTouch Delica Lancets 33G MISC Use to check blood sugar once a day 100 each 3   No current facility-administered medications for this visit.    SURGICAL HISTORY: No past surgical history on file.  REVIEW OF SYSTEMS:   Review of Systems  Constitutional: Negative for appetite change, chills, fatigue, fever and unexpected weight change.  HENT:   Negative for mouth sores, nosebleeds, sore throat and trouble swallowing.   Eyes: Negative for eye problems and icterus.  Respiratory: Negative for cough, hemoptysis, shortness of breath and wheezing.   Cardiovascular: Negative for chest pain and leg swelling.  Gastrointestinal: Negative for abdominal pain, constipation, diarrhea, nausea and vomiting.  Genitourinary: Negative for bladder incontinence, difficulty urinating, dysuria, frequency and hematuria.   Musculoskeletal: Negative for back pain, gait problem, neck pain and neck stiffness.  Skin: Negative for itching and rash.  Neurological: Negative for dizziness, extremity weakness, gait problem, headaches, light-headedness and seizures.  Hematological: Negative for adenopathy. Does not bruise/bleed easily.  Psychiatric/Behavioral:  Negative for confusion, depression and sleep disturbance. The patient is not nervous/anxious.     PHYSICAL EXAMINATION:  There were no vitals taken for this visit.  ECOG PERFORMANCE STATUS: {CHL ONC ECOG [id number]  Physical Exam  Constitutional: Oriented to person, place, and time and well-developed, well-nourished, and in no distress. No distress.   HENT:  Head: Normocephalic and atraumatic.  Mouth/Throat: Oropharynx is clear and moist. No oropharyngeal exudate.  Eyes: Conjunctivae are normal. Right eye exhibits no discharge. Left eye exhibits no discharge. No scleral icterus.  Neck: Normal range of motion. Neck supple.  Cardiovascular: Normal rate, regular rhythm, normal heart sounds and intact distal pulses.   Pulmonary/Chest: Effort normal and breath sounds normal. No respiratory distress. No wheezes. No rales.  Abdominal: Soft. Bowel sounds are normal. Exhibits no distension and no mass. There is no tenderness.  Musculoskeletal: Normal range of motion. Exhibits no edema.  Lymphadenopathy:    No cervical adenopathy.  Neurological: Alert and oriented to person, place, and time. Exhibits normal muscle tone. Gait normal. Coordination normal.  Skin: Skin is warm and dry. No rash noted. Not diaphoretic. No erythema. No pallor.  Psychiatric: Mood, memory and judgment normal.  Vitals reviewed.  LABORATORY DATA: Lab Results  Component Value Date   WBC 4.3 02/19/2024   HGB 16.6 02/19/2024   HCT 50.5 02/19/2024   MCV 80.5 02/19/2024   PLT 234 02/19/2024      Chemistry      Component Value Date/Time   NA 139 04/24/2024 1158   K 4.0 04/24/2024 1158   CL 104 04/24/2024 1158   CO2 26 04/24/2024 1158   BUN 16 04/24/2024 1158   CREATININE 0.89 04/24/2024 1158      Component Value Date/Time   CALCIUM 9.7 04/24/2024 1158   ALKPHOS 69 02/19/2024 1134   AST 20 02/19/2024 1134   ALT 24 02/19/2024 1134   BILITOT 1.1 02/19/2024 1134       RADIOGRAPHIC STUDIES:  No results found.   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 Polycythemia vera could not be completely excluded at this point.   The patient has sleep apnea and is *** with his CPAP  He also is taking ***aspirin and has reduced his vitamin  intake  ***JAK2 if persistently elevated.  The patient was seen with Dr. Sherrod today. Labs were reviewed which shows ***.   ***We will arrange for ***.   ***F/U  The patient was advised to call immediately if he has any concerning symptoms in the interval. The patient voices understanding of current disease status and treatment options and is in agreement with the current care plan. All questions were answered. The patient knows to call the clinic with any problems, questions or concerns. We can certainly see the patient much sooner if necessary      No orders of the defined types were placed in this encounter.    I spent {CHL ONC TIME VISIT - DTPQU:[id number]} counseling the patient face to face. The total time spent in the appointment was {CHL ONC TIME VISIT - DTPQU:[id number]}.  Sareen Randon L Hulbert Branscome, PA-C 06/01/24

## 2024-06-02 ENCOUNTER — Ambulatory Visit: Admitting: Internal Medicine

## 2024-06-02 ENCOUNTER — Other Ambulatory Visit

## 2024-06-02 ENCOUNTER — Telehealth: Payer: Self-pay

## 2024-06-02 NOTE — Telephone Encounter (Signed)
 Spoke with patient in regards to missed appt today.  He states he called and left multiple messages with scheduling to reschedule appt for today.    Rescheduled patient for 06/17/24 with labs at 1045 AM and visit with Cassie, PA at 1130 AM.  He voiced understanding.

## 2024-06-11 NOTE — Progress Notes (Unsigned)
 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 But cannot exclude PCV at this point  PRIOR THERAPY: None  CURRENT THERAPY: ***  INTERVAL HISTORY: Jon Vega 58 y.o. male returns to the clinic today for a follow up visit. The patient established care in the clinic with Dr. Sherrod on 02/19/24.   The patient was referred to the clinic of polycythemia which Dr. Sherrod believed may be related to sleep apnea. The patient is *** with his CPAP.   He also is taking ***aspirin?  ****He also has reduced his read meat intake to *** per weel  ***Make sure not taking iron containing supplements.  If his Hbg continued to be elvated, then Dr. Sherrod would recommend JAK2 testing.   The patient denies any changes in his health since last being seen. He takes hydrochlorothiazide for his hypertension. His energy is ***. He denies unusual headaches, flushing, lightheadedness, or dizziness. He denies strokes or MI. He denies vision changes. He denies itching after a hot shower. He denies steroid or testosterone use. He is here for evaluation and repeat blood work.   MEDICAL HISTORY: Past Medical History:  Diagnosis Date   Asthma    Asthma 10/22/2012   Diabetes mellitus without complication (HCC)    Dyslipidemia 10/22/2012   Hypertension     ALLERGIES:  is allergic to grass pollen(k-o-r-t-swt vern) and benazepril hcl.  MEDICATIONS:  Current Outpatient Medications  Medication Sig Dispense Refill   albuterol (PROVENTIL HFA;VENTOLIN HFA) 108 (90 BASE) MCG/ACT inhaler Inhale 2 puffs into the lungs every 6 (six) hours as needed for wheezing.     amLODipine (NORVASC) 10 MG tablet Take 10 mg by mouth daily.     aspirin 81 MG tablet Take 81 mg by mouth daily.     atorvastatin (LIPITOR) 20 MG tablet Take 1 tablet by mouth daily.     BITTER MELON  PO Take by mouth.     empagliflozin  (JARDIANCE ) 25 MG TABS tablet Take 1 tablet (25 mg total) by mouth daily before breakfast. 90 tablet 3   glucose blood (ONETOUCH VERIO) test strip Use to check blood sugar once a day 100 each 6   metFORMIN  (GLUCOPHAGE -XR) 500 MG 24 hr tablet Take 4 tablets (2,000 mg total) by mouth daily. 360 tablet 3   Multiple Vitamin (MULTIVITAMIN) tablet Take 1 tablet by mouth daily.     OneTouch Delica Lancets 33G MISC Use to check blood sugar once a day 100 each 3   No current facility-administered medications for this visit.    SURGICAL HISTORY: No past surgical history on file.  REVIEW OF SYSTEMS:   Review of Systems  Constitutional: Negative for appetite change, chills, fatigue, fever and unexpected weight change.  HENT:   Negative for mouth sores, nosebleeds, sore throat and trouble swallowing.   Eyes: Negative for eye problems and icterus.  Respiratory: Negative for cough, hemoptysis, shortness of breath and wheezing.   Cardiovascular: Negative for chest pain and leg swelling.  Gastrointestinal: Negative for abdominal pain, constipation, diarrhea, nausea and vomiting.  Genitourinary: Negative for bladder incontinence, difficulty urinating, dysuria, frequency and hematuria.   Musculoskeletal: Negative for back pain, gait problem, neck pain and neck stiffness.  Skin: Negative for itching and rash.  Neurological: Negative for dizziness, extremity weakness, gait problem, headaches, light-headedness and seizures.  Hematological: Negative for adenopathy. Does not bruise/bleed easily.  Psychiatric/Behavioral:  Negative for confusion, depression and sleep disturbance. The patient is not nervous/anxious.     PHYSICAL EXAMINATION:  There were no vitals taken for this visit.  ECOG PERFORMANCE STATUS: {CHL ONC ECOG [id number]  Physical Exam  Constitutional: Oriented to person, place, and time and well-developed, well-nourished, and in no distress. No distress.   HENT:  Head: Normocephalic and atraumatic.  Mouth/Throat: Oropharynx is clear and moist. No oropharyngeal exudate.  Eyes: Conjunctivae are normal. Right eye exhibits no discharge. Left eye exhibits no discharge. No scleral icterus.  Neck: Normal range of motion. Neck supple.  Cardiovascular: Normal rate, regular rhythm, normal heart sounds and intact distal pulses.   Pulmonary/Chest: Effort normal and breath sounds normal. No respiratory distress. No wheezes. No rales.  Abdominal: Soft. Bowel sounds are normal. Exhibits no distension and no mass. There is no tenderness.  Musculoskeletal: Normal range of motion. Exhibits no edema.  Lymphadenopathy:    No cervical adenopathy.  Neurological: Alert and oriented to person, place, and time. Exhibits normal muscle tone. Gait normal. Coordination normal.  Skin: Skin is warm and dry. No rash noted. Not diaphoretic. No erythema. No pallor.  Psychiatric: Mood, memory and judgment normal.  Vitals reviewed.  LABORATORY DATA: Lab Results  Component Value Date   WBC 4.3 02/19/2024   HGB 16.6 02/19/2024   HCT 50.5 02/19/2024   MCV 80.5 02/19/2024   PLT 234 02/19/2024      Chemistry      Component Value Date/Time   NA 139 04/24/2024 1158   K 4.0 04/24/2024 1158   CL 104 04/24/2024 1158   CO2 26 04/24/2024 1158   BUN 16 04/24/2024 1158   CREATININE 0.89 04/24/2024 1158      Component Value Date/Time   CALCIUM 9.7 04/24/2024 1158   ALKPHOS 69 02/19/2024 1134   AST 20 02/19/2024 1134   ALT 24 02/19/2024 1134   BILITOT 1.1 02/19/2024 1134       RADIOGRAPHIC STUDIES:  No results found.   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 Polycythemia vera could not be completely excluded at this point.   The patient has sleep apnea and is *** with his CPAP  He also is taking ***aspirin and has reduced his vitamin  intake  ***JAK2 if persistently elevated.  The patient was seen with Dr. Sherrod today. Labs were reviewed which shows ***.   ***We will arrange for ***.   ***F/U  The patient was advised to call immediately if he has any concerning symptoms in the interval. The patient voices understanding of current disease status and treatment options and is in agreement with the current care plan. All questions were answered. The patient knows to call the clinic with any problems, questions or concerns. We can certainly see the patient much sooner if necessary      No orders of the defined types were placed in this encounter.    I spent {CHL ONC TIME VISIT - DTPQU:[id number]} counseling the patient face to face. The total time spent in the appointment was {CHL ONC TIME VISIT - DTPQU:[id number]}.  Cardin Nitschke L Xariah Silvernail, PA-C 06/11/2024

## 2024-06-17 ENCOUNTER — Inpatient Hospital Stay

## 2024-06-17 ENCOUNTER — Inpatient Hospital Stay: Attending: Physician Assistant | Admitting: Physician Assistant

## 2024-06-17 VITALS — BP 143/94 | HR 80 | Temp 97.2°F | Resp 16 | Wt 173.5 lb

## 2024-06-17 DIAGNOSIS — Z7982 Long term (current) use of aspirin: Secondary | ICD-10-CM | POA: Diagnosis not present

## 2024-06-17 DIAGNOSIS — D751 Secondary polycythemia: Secondary | ICD-10-CM

## 2024-06-17 DIAGNOSIS — G4733 Obstructive sleep apnea (adult) (pediatric): Secondary | ICD-10-CM | POA: Diagnosis not present

## 2024-06-17 LAB — CBC WITH DIFFERENTIAL (CANCER CENTER ONLY)
Abs Immature Granulocytes: 0.01 K/uL (ref 0.00–0.07)
Basophils Absolute: 0 K/uL (ref 0.0–0.1)
Basophils Relative: 1 %
Eosinophils Absolute: 0.1 K/uL (ref 0.0–0.5)
Eosinophils Relative: 2 %
HCT: 50.3 % (ref 39.0–52.0)
Hemoglobin: 16.9 g/dL (ref 13.0–17.0)
Immature Granulocytes: 0 %
Lymphocytes Relative: 33 %
Lymphs Abs: 1.7 K/uL (ref 0.7–4.0)
MCH: 26.4 pg (ref 26.0–34.0)
MCHC: 33.6 g/dL (ref 30.0–36.0)
MCV: 78.7 fL — ABNORMAL LOW (ref 80.0–100.0)
Monocytes Absolute: 0.4 K/uL (ref 0.1–1.0)
Monocytes Relative: 7 %
Neutro Abs: 3 K/uL (ref 1.7–7.7)
Neutrophils Relative %: 57 %
Platelet Count: 220 K/uL (ref 150–400)
RBC: 6.39 MIL/uL — ABNORMAL HIGH (ref 4.22–5.81)
RDW: 12.9 % (ref 11.5–15.5)
WBC Count: 5.3 K/uL (ref 4.0–10.5)
nRBC: 0 % (ref 0.0–0.2)

## 2024-06-17 LAB — FERRITIN: Ferritin: 98 ng/mL (ref 24–336)

## 2024-06-17 LAB — IRON AND IRON BINDING CAPACITY (CC-WL,HP ONLY)
Iron: 71 ug/dL (ref 45–182)
Saturation Ratios: 20 % (ref 17.9–39.5)
TIBC: 356 ug/dL (ref 250–450)
UIBC: 285 ug/dL

## 2024-06-18 LAB — ERYTHROPOIETIN: Erythropoietin: 5.2 m[IU]/mL (ref 2.6–18.5)

## 2024-07-02 LAB — NGS JAK2 E12-15/CALR/MPL

## 2024-07-07 ENCOUNTER — Other Ambulatory Visit: Payer: Self-pay | Admitting: Endocrinology

## 2024-07-07 DIAGNOSIS — E1165 Type 2 diabetes mellitus with hyperglycemia: Secondary | ICD-10-CM

## 2024-07-08 ENCOUNTER — Telehealth: Payer: Self-pay

## 2024-07-08 NOTE — Telephone Encounter (Signed)
 Spoke with patient regarding lab results. Per Cassie, PA, genetic testing performed to evaluate for a genetic alteration contributing to elevated RBCs was negative. At this time, no further follow-up with our office is required. Patient was encouraged to continue following recommendations provided by the managing provider for his sleep apnea.  Patient was advised to contact our office in the future with any questions or concerns. He verbalized understanding.

## 2024-10-24 ENCOUNTER — Other Ambulatory Visit

## 2024-10-29 ENCOUNTER — Ambulatory Visit: Admitting: Endocrinology
